# Patient Record
Sex: Male | Born: 1935 | Race: White | Hispanic: No | Smoking: Former smoker
Health system: Southern US, Community
[De-identification: ages and names within clinical notes are randomized; demographics above are authoritative.]

## PROBLEM LIST (undated history)

## (undated) DIAGNOSIS — I10 Essential (primary) hypertension: Secondary | ICD-10-CM

## (undated) DIAGNOSIS — N189 Chronic kidney disease, unspecified: Secondary | ICD-10-CM

---

## 2004-06-03 ENCOUNTER — Ambulatory Visit: Payer: Self-pay | Admitting: Surgery

## 2004-06-09 ENCOUNTER — Ambulatory Visit: Payer: Self-pay | Admitting: Surgery

## 2012-06-24 ENCOUNTER — Emergency Department: Payer: Self-pay | Admitting: Emergency Medicine

## 2012-06-24 LAB — URINALYSIS, COMPLETE
Bilirubin,UR: NEGATIVE
Blood: NEGATIVE
Glucose,UR: NEGATIVE mg/dL (ref 0–75)
Ketone: NEGATIVE
Leukocyte Esterase: NEGATIVE
Ph: 6 (ref 4.5–8.0)
Protein: NEGATIVE
RBC,UR: 2 /HPF (ref 0–5)
Squamous Epithelial: NONE SEEN
WBC UR: 3 /HPF (ref 0–5)

## 2012-06-24 LAB — CBC
MCHC: 33.4 g/dL (ref 32.0–36.0)
MCV: 85 fL (ref 80–100)
RBC: 4.19 10*6/uL — ABNORMAL LOW (ref 4.40–5.90)
RDW: 13 % (ref 11.5–14.5)

## 2012-06-24 LAB — COMPREHENSIVE METABOLIC PANEL
Albumin: 3.9 g/dL (ref 3.4–5.0)
Bilirubin,Total: 1.2 mg/dL — ABNORMAL HIGH (ref 0.2–1.0)
Co2: 29 mmol/L (ref 21–32)
Creatinine: 0.74 mg/dL (ref 0.60–1.30)
Osmolality: 282 (ref 275–301)
SGOT(AST): 29 U/L (ref 15–37)
SGPT (ALT): 21 U/L (ref 12–78)
Sodium: 141 mmol/L (ref 136–145)

## 2012-06-24 LAB — PROTIME-INR
INR: 1.1
Prothrombin Time: 14.1 secs (ref 11.5–14.7)

## 2012-06-24 LAB — TROPONIN I: Troponin-I: <0.02 ng/mL

## 2016-03-04 ENCOUNTER — Encounter: Admission: RE | Payer: Self-pay | Source: Ambulatory Visit

## 2016-03-04 ENCOUNTER — Ambulatory Visit
Admission: RE | Admit: 2016-03-04 | Payer: Medicare Other | Source: Ambulatory Visit | Admitting: Unknown Physician Specialty

## 2016-03-04 SURGERY — COLONOSCOPY WITH PROPOFOL
Anesthesia: General

## 2016-03-10 ENCOUNTER — Encounter: Payer: Self-pay | Admitting: *Deleted

## 2016-03-11 ENCOUNTER — Ambulatory Visit
Admission: RE | Admit: 2016-03-11 | Discharge: 2016-03-11 | Disposition: A | Discharge status: Home / Self Care | Payer: Medicare Other | Source: Ambulatory Visit | Attending: Unknown Physician Specialty | Admitting: Unknown Physician Specialty

## 2016-03-11 ENCOUNTER — Encounter
Admission: RE | Disposition: A | Discharge status: Home / Self Care | Payer: Self-pay | Source: Ambulatory Visit | Attending: Unknown Physician Specialty

## 2016-03-11 ENCOUNTER — Ambulatory Visit: Payer: Medicare Other | Admitting: Anesthesiology

## 2016-03-11 DIAGNOSIS — D123 Benign neoplasm of transverse colon: Secondary | ICD-10-CM | POA: Diagnosis not present

## 2016-03-11 DIAGNOSIS — K640 First degree hemorrhoids: Secondary | ICD-10-CM | POA: Insufficient documentation

## 2016-03-11 DIAGNOSIS — Z79899 Other long term (current) drug therapy: Secondary | ICD-10-CM | POA: Diagnosis not present

## 2016-03-11 DIAGNOSIS — N189 Chronic kidney disease, unspecified: Secondary | ICD-10-CM | POA: Insufficient documentation

## 2016-03-11 DIAGNOSIS — K635 Polyp of colon: Secondary | ICD-10-CM | POA: Insufficient documentation

## 2016-03-11 DIAGNOSIS — K269 Duodenal ulcer, unspecified as acute or chronic, without hemorrhage or perforation: Secondary | ICD-10-CM | POA: Diagnosis not present

## 2016-03-11 DIAGNOSIS — K2950 Unspecified chronic gastritis without bleeding: Secondary | ICD-10-CM | POA: Diagnosis not present

## 2016-03-11 DIAGNOSIS — I8500 Esophageal varices without bleeding: Secondary | ICD-10-CM | POA: Diagnosis not present

## 2016-03-11 DIAGNOSIS — D509 Iron deficiency anemia, unspecified: Secondary | ICD-10-CM | POA: Insufficient documentation

## 2016-03-11 DIAGNOSIS — I129 Hypertensive chronic kidney disease with stage 1 through stage 4 chronic kidney disease, or unspecified chronic kidney disease: Secondary | ICD-10-CM | POA: Diagnosis not present

## 2016-03-11 DIAGNOSIS — K5730 Diverticulosis of large intestine without perforation or abscess without bleeding: Secondary | ICD-10-CM | POA: Diagnosis not present

## 2016-03-11 DIAGNOSIS — Z87891 Personal history of nicotine dependence: Secondary | ICD-10-CM | POA: Insufficient documentation

## 2016-03-11 DIAGNOSIS — Q2733 Arteriovenous malformation of digestive system vessel: Secondary | ICD-10-CM | POA: Insufficient documentation

## 2016-03-11 HISTORY — DX: Chronic kidney disease, unspecified: N18.9

## 2016-03-11 HISTORY — PX: ESOPHAGOGASTRODUODENOSCOPY (EGD) WITH PROPOFOL: SHX5813

## 2016-03-11 HISTORY — DX: Essential (primary) hypertension: I10

## 2016-03-11 HISTORY — PX: COLONOSCOPY WITH PROPOFOL: SHX5780

## 2016-03-11 SURGERY — COLONOSCOPY WITH PROPOFOL
Anesthesia: General

## 2016-03-11 MED ORDER — FENTANYL CITRATE (PF) 100 MCG/2ML IJ SOLN
INTRAMUSCULAR | Status: AC
  Filled 2016-03-11: qty 2

## 2016-03-11 MED ORDER — LIDOCAINE HCL (PF) 1 % IJ SOLN
2.0000 mL | Freq: Once | INTRAMUSCULAR | Status: AC
  Administered 2016-03-11: 0.03 mL via INTRADERMAL
  Filled 2016-03-11: qty 2

## 2016-03-11 MED ORDER — MIDAZOLAM HCL 5 MG/5ML IJ SOLN
INTRAMUSCULAR | Status: DC | PRN
  Administered 2016-03-11 (×2): 1 mg via INTRAVENOUS

## 2016-03-11 MED ORDER — MIDAZOLAM HCL 2 MG/2ML IJ SOLN
INTRAMUSCULAR | Status: AC
  Filled 2016-03-11: qty 2

## 2016-03-11 MED ORDER — PROPOFOL 10 MG/ML IV BOLUS
INTRAVENOUS | Status: DC | PRN
  Administered 2016-03-11: 20 mg via INTRAVENOUS
  Administered 2016-03-11: 10 mg via INTRAVENOUS
  Administered 2016-03-11: 20 mg via INTRAVENOUS

## 2016-03-11 MED ORDER — SODIUM CHLORIDE 0.9 % IV SOLN: INTRAVENOUS | Status: DC

## 2016-03-11 MED ORDER — LIDOCAINE HCL (PF) 2 % IJ SOLN
INTRAMUSCULAR | Status: DC | PRN
  Administered 2016-03-11: 50 mg

## 2016-03-11 MED ORDER — FENTANYL CITRATE (PF) 100 MCG/2ML IJ SOLN
INTRAMUSCULAR | Status: DC | PRN
  Administered 2016-03-11 (×4): 25 ug via INTRAVENOUS

## 2016-03-11 MED ORDER — SODIUM CHLORIDE 0.9 % IV SOLN
INTRAVENOUS | Status: DC
  Administered 2016-03-11: 1000 mL via INTRAVENOUS

## 2016-03-11 MED ORDER — PROPOFOL 500 MG/50ML IV EMUL
INTRAVENOUS | Status: DC | PRN
  Administered 2016-03-11: 50 ug/kg/min via INTRAVENOUS

## 2016-03-11 NOTE — Anesthesia Post-op Follow-up Note (Cosign Needed)
Anesthesia QCDR form completed.        

## 2016-03-11 NOTE — Op Note (Signed)
[facility] Patient Name: Caleb Reynolds Procedure Date: 03/11/2016 10:03 AM MRN: [id number] Account #: [id number] Date of Birth: 28-Sep-1935 Admit Type: Outpatient Age: 81 Room: [facility] ENDO ROOM 4 Gender: Male Note Status: Finalized Procedure:            Upper GI endoscopy Indications:          Unexplained iron deficiency anemia, Heme positive stool Providers:            Manya Silvas, MD Referring MD:         Leona Carry. Hall Busing, MD (Referring MD) Medicines:            Propofol per Anesthesia Complications:        No immediate complications. Procedure:            Pre-Anesthesia Assessment:                       - After reviewing the risks and benefits, the patient                        was deemed in satisfactory condition to undergo the                        procedure.                       After obtaining informed consent, the endoscope was                        passed under direct vision. Throughout the procedure,                        the patient's blood pressure, pulse, and oxygen                        saturations were monitored continuously. The Endoscope                        was introduced through the mouth, and advanced to the                        second part of duodenum. The upper GI endoscopy was                        accomplished without difficulty. The patient tolerated                        the procedure well. Findings:      One column of non-bleeding grade I varices were found in the middle       third of the esophagus and in the lower third of the esophagus,. No       stigmata of recent bleeding were evident and no red wale signs were       present.      A single small no bleeding angioectasia was found on the lesser       curvature of the proximal gastric body. Coagulation for tissue       destruction using argon plasma at 1 liters/minute and 20 watts was       successful. To prevent bleeding post-intervention, one  hemostatic clip       was successfully placed. There  was no bleeding at the end of the       procedure.      Patchy minimal inflammation characterized by erythema and granularity       was found in the gastric antrum. Biopsies were taken with a cold forceps       for histology. Biopsies were taken with a cold forceps for Helicobacter       pylori testing.      One non-bleeding superficial duodenal ulcer with no stigmata of bleeding       was found in the second portion of the duodenum. The lesion was 4 mm in       largest dimension.      A small hiatal hernia was present. 2 cm in length. Impression:           - Non-bleeding grade I esophageal varices.                       - A single non-bleeding angioectasia in the stomach.                        Treated with argon plasma coagulation (APC). Clip was                        placed.                       - Gastritis. Biopsied.                       - One non-bleeding duodenal ulcer with no stigmata of                        bleeding. Recommendation:       - Await pathology results. Manya Silvas, MD 03/11/2016 10:28:43 AM This report has been signed electronically. Number of Addenda: 0 Note Initiated On: 03/11/2016 10:03 AM      [facility]

## 2016-03-11 NOTE — Transfer of Care (Signed)
Immediate Anesthesia Transfer of Care Note  Patient: Caleb Reynolds  Procedure(s) Performed: Procedure(s): COLONOSCOPY WITH PROPOFOL (N/A) ESOPHAGOGASTRODUODENOSCOPY (EGD) WITH PROPOFOL (N/A)  Patient Location: PACU  Anesthesia Type:General  Level of Consciousness: sedated  Airway & Oxygen Therapy: Patient Spontanous Breathing and Patient connected to nasal cannula oxygen  Post-op Assessment: Report given to RN and Post -op Vital signs reviewed and stable  Post vital signs: Reviewed  Last Vitals:  Vitals:   03/11/16 0922 03/11/16 1056  BP: (!) 143/68 117/67  Pulse: 85 65  Resp: 17 13  Temp: 36.1 C 36.2 C    Last Pain:  Vitals:   03/11/16 1056  TempSrc: Tympanic         Complications: No apparent anesthesia complications

## 2016-03-11 NOTE — H&P (Signed)
   Primary Care Physician:  Albina Billet, MD Primary Gastroenterologist:  Dr. Vira Agar  Pre-Procedure History & Physical: HPI:  Caleb Reynolds is a 81 y.o. male is here for an endoscopy and colonoscopy.   Past Medical History:  Diagnosis Date  . Chronic kidney disease    kidney stones  . Hypertension     No past surgical history on file.  Prior to Admission medications   Medication Sig Start Date End Date Taking? Authorizing Provider  amLODipine (NORVASC) 10 MG tablet Take 10 mg by mouth daily.   Yes Historical Provider, MD  pantoprazole (PROTONIX) 40 MG tablet Take 40 mg by mouth 2 (two) times daily before a meal.   Yes Historical Provider, MD  sucralfate (CARAFATE) 1 g tablet Take 1 g by mouth 4 (four) times daily.   Yes Historical Provider, MD    Allergies as of 03/09/2016  . (Not on File)    No family history on file.  Social History   Social History  . Marital status: Married    Spouse name: N/A  . Number of children: N/A  . Years of education: N/A   Occupational History  . Not on file.   Social History Main Topics  . Smoking status: Former Research scientist (life sciences)  . Smokeless tobacco: Never Used  . Alcohol use No  . Drug use: No  . Sexual activity: Not on file   Other Topics Concern  . Not on file   Social History Narrative  . No narrative on file    Review of Systems: See HPI, otherwise negative ROS  Physical Exam: BP (!) 143/68   Pulse 85   Temp 97 F (36.1 C) (Tympanic)   Resp 17   Ht 5\' 11"  (1.803 m)   Wt 70.3 kg (155 lb)   SpO2 99%   BMI 21.62 kg/m  General:   Alert,  pleasant and cooperative in NAD Head:  Normocephalic and atraumatic. Neck:  Supple; no masses or thyromegaly. Lungs:  Clear throughout to auscultation.    Heart:  Regular rate and rhythm. Abdomen:  Soft, nontender and nondistended. Normal bowel sounds, without guarding, and without rebound.   Neurologic:  Alert and  oriented x4;  grossly normal  neurologically.  Impression/Plan: Caleb Reynolds is here for an endoscopy and colonoscopy to be performed for iron def anemia and heme positive stool.  Risks, benefits, limitations, and alternatives regarding  endoscopy and colonoscopy have been reviewed with the patient.  Questions have been answered.  All parties agreeable.   Gaylyn Cheers, MD  03/11/2016, 9:59 AM

## 2016-03-11 NOTE — Anesthesia Preprocedure Evaluation (Signed)
Anesthesia Evaluation  Patient identified by MRN, date of birth, ID band Patient awake    Reviewed: Allergy & Precautions, NPO status , Patient's Chart, lab work & pertinent test results  History of Anesthesia Complications Negative for: history of anesthetic complications  Airway Mallampati: II  TM Distance: >3 FB Neck ROM: Full    Dental  (+) Missing, Poor Dentition   Pulmonary neg sleep apnea, neg COPD, former smoker,    breath sounds clear to auscultation- rhonchi (-) wheezing      Cardiovascular Exercise Tolerance: Good hypertension, Pt. on medications (-) CAD and (-) Past MI  Rhythm:Regular Rate:Normal - Systolic murmurs and - Diastolic murmurs    Neuro/Psych negative neurological ROS  negative psych ROS   GI/Hepatic negative GI ROS, Neg liver ROS,   Endo/Other  negative endocrine ROSneg diabetes  Renal/GU negative Renal ROS     Musculoskeletal negative musculoskeletal ROS (+)   Abdominal (+) - obese,   Peds  Hematology negative hematology ROS (+)   Anesthesia Other Findings Past Medical History: No date: Chronic kidney disease     Comment: kidney stones No date: Hypertension   Reproductive/Obstetrics                             Anesthesia Physical Anesthesia Plan  ASA: II  Anesthesia Plan: General   Post-op Pain Management:    Induction: Intravenous  Airway Management Planned: Natural Airway  Additional Equipment:   Intra-op Plan:   Post-operative Plan:   Informed Consent: I have reviewed the patients History and Physical, chart, labs and discussed the procedure including the risks, benefits and alternatives for the proposed anesthesia with the patient or authorized representative who has indicated his/her understanding and acceptance.   Dental advisory given  Plan Discussed with: CRNA and Anesthesiologist  Anesthesia Plan Comments:         Anesthesia  Quick Evaluation

## 2016-03-11 NOTE — Anesthesia Postprocedure Evaluation (Signed)
Anesthesia Post Note  Patient: Caleb Reynolds  Procedure(s) Performed: Procedure(s) (LRB): COLONOSCOPY WITH PROPOFOL (N/A) ESOPHAGOGASTRODUODENOSCOPY (EGD) WITH PROPOFOL (N/A)  Patient location during evaluation: Endoscopy Anesthesia Type: General Level of consciousness: awake and alert and oriented Pain management: pain level controlled Vital Signs Assessment: post-procedure vital signs reviewed and stable Respiratory status: spontaneous breathing, nonlabored ventilation and respiratory function stable Cardiovascular status: blood pressure returned to baseline and stable Postop Assessment: no signs of nausea or vomiting Anesthetic complications: no     Last Vitals:  Vitals:   03/11/16 1106 03/11/16 1116  BP: 125/69 135/74  Pulse: 65 71  Resp: 14 16  Temp:      Last Pain:  Vitals:   03/11/16 1056  TempSrc: Tympanic                 Sira [employer]ds[employer]

## 2016-03-11 NOTE — Op Note (Signed)
[facility] Patient Name: Caleb Reynolds Procedure Date: 03/11/2016 10:01 AM MRN: [id number] Account #: [id number] Date of Birth: [birth year] Admit Type: Outpatient Age: 81 Room: [facility] ENDO ROOM 4 Gender: Male Note Status: Finalized Procedure:            Colonoscopy Indications:          Heme positive stool, Unexplained iron deficiency anemia Providers:            Manya Silvas, MD Referring MD:         Leona Carry. Hall Busing, MD (Referring MD) Medicines:            Propofol per Anesthesia Complications:        No immediate complications. Procedure:            Pre-Anesthesia Assessment:                       - After reviewing the risks and benefits, the patient                        was deemed in satisfactory condition to undergo the                        procedure.                       After obtaining informed consent, the colonoscope was                        passed under direct vision. Throughout the procedure,                        the patient's blood pressure, pulse, and oxygen                        saturations were monitored continuously. The                        Colonoscope was introduced through the anus and                        advanced to the the cecum, identified by appendiceal                        orifice and ileocecal valve. The colonoscopy was                        performed without difficulty. The patient tolerated the                        procedure well. The quality of the bowel preparation                        was excellent. Findings:      Three sessile polyps were found in the sigmoid colon and transverse       colon. The polyps were small in size. These polyps were removed with a       hot snare. Resection and retrieval were complete. To prevent bleeding       after the polypectomy, one hemostatic clip was successfully placed.       There was no bleeding during, or at  the end, of the procedure.      Many  small-mouthed diverticula were found in the sigmoid colon and       descending colon.      Internal hemorrhoids were found during endoscopy. The hemorrhoids were       small and Grade I (internal hemorrhoids that do not prolapse).      The exam was otherwise without abnormality. Impression:           - Three small polyps in the sigmoid colon and in the                        transverse colon, removed with a hot snare. Resected                        and retrieved. Clip was placed.                       - Diverticulosis in the sigmoid colon and in the                        descending colon.                       - Internal hemorrhoids.                       - The examination was otherwise normal. Recommendation:       - Await pathology results. Manya Silvas, MD 03/11/2016 10:56:51 AM This report has been signed electronically. Number of Addenda: 0 Note Initiated On: 03/11/2016 10:01 AM Scope Withdrawal Time: 0 hours 12 minutes 36 seconds  Total Procedure Duration: 0 hours 23 minutes 22 seconds       [facility]

## 2016-03-12 ENCOUNTER — Encounter: Payer: Self-pay | Admitting: Unknown Physician Specialty

## 2016-03-12 LAB — SURGICAL PATHOLOGY

## 2019-05-22 ENCOUNTER — Encounter: Payer: Self-pay | Admitting: Emergency Medicine

## 2019-05-22 ENCOUNTER — Inpatient Hospital Stay
Admission: EM | Admit: 2019-05-22 | Discharge: 2019-05-24 | DRG: 066 | Disposition: A | Discharge status: Home / Self Care | Payer: Medicare Other | Attending: Internal Medicine | Admitting: Internal Medicine

## 2019-05-22 ENCOUNTER — Emergency Department: Payer: Medicare Other

## 2019-05-22 ENCOUNTER — Other Ambulatory Visit: Payer: Self-pay

## 2019-05-22 DIAGNOSIS — I639 Cerebral infarction, unspecified: Principal | ICD-10-CM | POA: Diagnosis present

## 2019-05-22 DIAGNOSIS — N189 Chronic kidney disease, unspecified: Secondary | ICD-10-CM | POA: Diagnosis present

## 2019-05-22 DIAGNOSIS — E785 Hyperlipidemia, unspecified: Secondary | ICD-10-CM | POA: Diagnosis present

## 2019-05-22 DIAGNOSIS — I129 Hypertensive chronic kidney disease with stage 1 through stage 4 chronic kidney disease, or unspecified chronic kidney disease: Secondary | ICD-10-CM | POA: Diagnosis present

## 2019-05-22 DIAGNOSIS — I10 Essential (primary) hypertension: Secondary | ICD-10-CM

## 2019-05-22 DIAGNOSIS — Z87891 Personal history of nicotine dependence: Secondary | ICD-10-CM

## 2019-05-22 DIAGNOSIS — Z20822 Contact with and (suspected) exposure to covid-19: Secondary | ICD-10-CM | POA: Diagnosis present

## 2019-05-22 DIAGNOSIS — R29700 NIHSS score 0: Secondary | ICD-10-CM | POA: Diagnosis present

## 2019-05-22 DIAGNOSIS — H532 Diplopia: Secondary | ICD-10-CM | POA: Diagnosis present

## 2019-05-22 LAB — COMPREHENSIVE METABOLIC PANEL
ALT: 17 U/L (ref 0–44)
AST: 21 U/L (ref 15–41)
Albumin: 4.5 g/dL (ref 3.5–5.0)
Alkaline Phosphatase: 25 U/L — ABNORMAL LOW (ref 38–126)
Anion gap: 9 (ref 5–15)
BUN: 8 mg/dL (ref 8–23)
CO2: 28 mmol/L (ref 22–32)
Calcium: 8.8 mg/dL — ABNORMAL LOW (ref 8.9–10.3)
Chloride: 101 mmol/L (ref 98–111)
Creatinine, Ser: 0.68 mg/dL (ref 0.61–1.24)
GFR calc Af Amer: >60 mL/min (ref >60)
GFR calc non Af Amer: >60 mL/min (ref >60)
Glucose, Bld: 111 mg/dL — ABNORMAL HIGH (ref 70–99)
Potassium: 3.9 mmol/L (ref 3.5–5.1)
Sodium: 138 mmol/L (ref 135–145)
Total Bilirubin: 1.6 mg/dL — ABNORMAL HIGH (ref 0.3–1.2)
Total Protein: 7.6 g/dL (ref 6.5–8.1)

## 2019-05-22 LAB — CBC
HCT: 44.2 % (ref 39.0–52.0)
Hemoglobin: 14.6 g/dL (ref 13.0–17.0)
MCH: 30.2 pg (ref 26.0–34.0)
MCHC: 33 g/dL (ref 30.0–36.0)
MCV: 91.5 fL (ref 80.0–100.0)
Platelets: 300 10*3/uL (ref 150–400)
RBC: 4.83 MIL/uL (ref 4.22–5.81)
RDW: 12.1 % (ref 11.5–15.5)
WBC: 5.2 10*3/uL (ref 4.0–10.5)
nRBC: 0 % (ref 0.0–0.2)

## 2019-05-22 LAB — APTT: aPTT: 36 seconds (ref 24–36)

## 2019-05-22 LAB — DIFFERENTIAL
Abs Immature Granulocytes: 0.04 10*3/uL (ref 0.00–0.07)
Basophils Absolute: 0.1 10*3/uL (ref 0.0–0.1)
Basophils Relative: 2 %
Eosinophils Absolute: 0 10*3/uL (ref 0.0–0.5)
Eosinophils Relative: 0 %
Immature Granulocytes: 1 %
Lymphocytes Relative: 19 %
Lymphs Abs: 1 10*3/uL (ref 0.7–4.0)
Monocytes Absolute: 0.4 10*3/uL (ref 0.1–1.0)
Monocytes Relative: 7 %
Neutro Abs: 3.7 10*3/uL (ref 1.7–7.7)
Neutrophils Relative %: 71 %

## 2019-05-22 LAB — PROTIME-INR
INR: 1 (ref 0.8–1.2)
Prothrombin Time: 13.2 seconds (ref 11.4–15.2)

## 2019-05-22 MED ORDER — SODIUM CHLORIDE 0.9% FLUSH: 3.0000 mL | Freq: Once | INTRAVENOUS | Status: DC

## 2019-05-22 MED ORDER — ACETAMINOPHEN 160 MG/5ML PO SOLN
650.0000 mg | ORAL | Status: DC | PRN
  Filled 2019-05-22: qty 20.3

## 2019-05-22 MED ORDER — ACETAMINOPHEN 325 MG PO TABS: 650.0000 mg | ORAL_TABLET | ORAL | Status: DC | PRN

## 2019-05-22 MED ORDER — SENNOSIDES-DOCUSATE SODIUM 8.6-50 MG PO TABS: 1.0000 | ORAL_TABLET | Freq: Every evening | ORAL | Status: DC | PRN

## 2019-05-22 MED ORDER — ENOXAPARIN SODIUM 40 MG/0.4ML ~~LOC~~ SOLN
40.0000 mg | SUBCUTANEOUS | Status: DC
  Administered 2019-05-23 – 2019-05-24 (×2): 40 mg via SUBCUTANEOUS
  Filled 2019-05-22 (×2): qty 0.4

## 2019-05-22 MED ORDER — GADOBUTROL 1 MMOL/ML IV SOLN
6.0000 mL | Freq: Once | INTRAVENOUS | Status: AC | PRN
  Administered 2019-05-22: 22:00:00 6 mL via INTRAVENOUS

## 2019-05-22 MED ORDER — MECLIZINE HCL 25 MG PO TABS
25.0000 mg | ORAL_TABLET | Freq: Once | ORAL | Status: AC
  Administered 2019-05-22: 25 mg via ORAL
  Filled 2019-05-22: qty 1

## 2019-05-22 MED ORDER — ASPIRIN EC 81 MG PO TBEC
81.0000 mg | DELAYED_RELEASE_TABLET | Freq: Every day | ORAL | Status: DC
  Administered 2019-05-23 – 2019-05-24 (×2): 81 mg via ORAL
  Filled 2019-05-22 (×2): qty 1

## 2019-05-22 MED ORDER — ASPIRIN 81 MG PO CHEW
162.0000 mg | CHEWABLE_TABLET | Freq: Once | ORAL | Status: AC
  Administered 2019-05-22: 162 mg via ORAL
  Filled 2019-05-22: qty 2

## 2019-05-22 MED ORDER — ACETAMINOPHEN 650 MG RE SUPP: 650.0000 mg | RECTAL | Status: DC | PRN

## 2019-05-22 MED ORDER — STROKE: EARLY STAGES OF RECOVERY BOOK: Freq: Once | Status: DC

## 2019-05-22 MED ORDER — ATORVASTATIN CALCIUM 20 MG PO TABS: 40.0000 mg | ORAL_TABLET | Freq: Every day | ORAL | Status: DC

## 2019-05-22 NOTE — ED Provider Notes (Signed)
[facility] Emergency Department Provider Note ____________________________________________   First MD Initiated Contact with Patient 05/22/19 1601     (approximate)  I have reviewed the triage vital signs and the nursing notes.   HISTORY  Chief Complaint Dizziness    HPI Caleb Reynolds is a 84 y.o. male with PMH as noted below who presents with dizziness since around 8 PM yesterday evening, described as a sensation of difficulty keeping his balance, and worse when he gets up and walks around.  He denies feeling near syncopal, and also denies a specific sensation spinning or movement.  He has not had anything like this before.  The patient also reports some double vision which resolves when he covers one eye.  He denies any nausea or vomiting, headache, fever, or other acute symptoms.  The patient went to see his primary care doctor today and was instructed to go to the ED for further evaluation.  Past Medical History:  Diagnosis Date  . Chronic kidney disease    kidney stones  . Hypertension     Patient Active Problem List   Diagnosis Date Noted  . Acute CVA (cerebrovascular accident) ([place]) 05/22/2019  . Essential hypertension 05/22/2019    Past Surgical History:  Procedure Laterality Date  . COLONOSCOPY WITH PROPOFOL N/A 03/11/2016   Procedure: COLONOSCOPY WITH PROPOFOL;  Surgeon: Manya Silvas, MD;  Location: [facility] ENDOSCOPY;  Service: Endoscopy;  Laterality: N/A;  . ESOPHAGOGASTRODUODENOSCOPY (EGD) WITH PROPOFOL N/A 03/11/2016   Procedure: ESOPHAGOGASTRODUODENOSCOPY (EGD) WITH PROPOFOL;  Surgeon: Manya Silvas, MD;  Location: [facility] ENDOSCOPY;  Service: Endoscopy;  Laterality: N/A;    Prior to Admission medications   Medication Sig Start Date End Date Taking? Authorizing Provider  amLODipine (NORVASC) 10 MG tablet Take 10 mg by mouth daily.    [provider]  pantoprazole (PROTONIX) 40 MG tablet Take 40 mg by mouth 2 (two) times  daily before a meal.    [provider]  sucralfate (CARAFATE) 1 g tablet Take 1 g by mouth 4 (four) times daily.    [provider]    Allergies Patient has no known allergies.  No family history on file.  Social History Social History   Tobacco Use  . Smoking status: Former Research scientist (life sciences)  . Smokeless tobacco: Never Used  Substance Use Topics  . Alcohol use: No  . Drug use: No    Review of Systems  Constitutional: No fever. Eyes: Positive for diplopia. ENT: No neck pain. Cardiovascular: Denies chest pain. Respiratory: Denies shortness of breath. Gastrointestinal: No vomiting or diarrhea.  Genitourinary: Negative for dysuria.  Musculoskeletal: Negative for back pain. Skin: Negative for rash. Neurological: Negative for headache.   ____________________________________________   PHYSICAL EXAM:  VITAL SIGNS: ED Triage Vitals  Enc Vitals Group     BP 05/22/19 1216 (!) 159/66     Pulse Rate 05/22/19 1216 93     Resp 05/22/19 1216 16     Temp 05/22/19 1216 98.4 F (36.9 C)     Temp Source 05/22/19 1216 Oral     SpO2 05/22/19 1216 98 %     Weight 05/22/19 1213 150 lb (68 kg)     Height 05/22/19 1213 5\' 11"  (1.803 m)     Head Circumference --      Peak Flow --      Pain Score 05/22/19 1213 0     Pain Loc --      Pain Edu? --  Excl. in [place]? --     Constitutional: Alert and oriented.  Very well appearing and in no acute distress. Eyes: Conjunctivae are normal.  EOMI.  PERRLA.  No nystagmus. Head: Atraumatic. Nose: No congestion/rhinnorhea. Mouth/Throat: Mucous membranes are moist.   Neck: Normal range of motion.  No palpable thrill.  No significant bruits. Cardiovascular: Normal rate, regular rhythm. Good peripheral circulation. Respiratory: Normal respiratory effort.  No retractions. Lungs CTAB. Gastrointestinal: No distention.  Musculoskeletal:  Extremities warm and well perfused.  Neurologic:  Normal speech and language.  5/5 motor strength  and intact sensation to all 4 extremities.  No pronator drift.  Normal coordination with no ataxia on finger-to-nose.  No facial droop.  Normal gait. Skin:  Skin is warm and dry. No rash noted. Psychiatric: Mood and affect are normal. Speech and behavior are normal.  ____________________________________________   LABS (all labs ordered are listed, but only abnormal results are displayed)  Labs Reviewed  COMPREHENSIVE METABOLIC PANEL - Abnormal; Notable for the following components:      Result Value   Glucose, Bld 111 (*)    Calcium 8.8 (*)    Alkaline Phosphatase 25 (*)    Total Bilirubin 1.6 (*)    All other components within normal limits  SARS CORONAVIRUS 2 (TAT 6-24 HRS)  PROTIME-INR  APTT  CBC  DIFFERENTIAL  HEMOGLOBIN A1C  LIPID PANEL  CREATININE, SERUM  I-STAT CREATININE, ED  CBG MONITORING, ED   ____________________________________________  EKG  ED ECG REPORT I, Arta Silence, the attending physician, personally viewed and interpreted this ECG.  Date: 05/22/2019 EKG Time: 1223 Rate: 90 Rhythm: normal sinus rhythm QRS Axis: normal Intervals: normal ST/T Wave abnormalities: normal Narrative Interpretation: no evidence of acute ischemia  ____________________________________________  RADIOLOGY  CT head: No acute abnormality MR brain: 2 cm left temporal lobe infarct  MR angio head: M2 right MCA branch stenosis MR angio neck: Possible left vertebral artery moderate stenosis, limited by motion.  Patent carotids.  ____________________________________________   PROCEDURES  Procedure(s) performed: No  Procedures  Critical Care performed: No ____________________________________________   INITIAL IMPRESSION / ASSESSMENT AND PLAN / ED COURSE  Pertinent labs & imaging results that were available during my care of the patient were reviewed by me and considered in my medical decision making (see chart for details).  84 year old male with PMH as  noted above and no prior stroke history presents with dizziness since last night mainly described as a sensation of being off balance, along with diplopia.  The patient saw his primary care doctor who instructed him to go to the ED.  He also mentioned hearing an abnormal sound all listening to his neck, presumably a bruit.  I reviewed the past medical records in [place].  I do not have access to notes from the patient's PCP today.  The patient has no recent ED visits or admissions.  On exam, the patient is overall very well-appearing for age.  His vital signs are normal except for mild hypertension.  Neurologic exam is normal including cerebellar signs.  I do not hear an obvious bruit in the neck although exam is somewhat limited by the latter environment of the ED.  Overall I suspect more likely peripheral vertigo given the relatively acute onset of the symptoms the presence of some nausea, and their positional nature.  However given the patient's age and description of the symptoms, central vertigo or other CNS etiology is possible.  CT head was negative.  I will proceed with an  MRI of the brain as well as an angio of the head and neck.   ----------------------------------------- 11:09 PM on 05/22/2019 -----------------------------------------  The patient has had no significant improvement in his symptoms, although is still able to ambulate and his overall coordination is normal.  MRI shows an acute infarct in the left temporal lobe and some stenosis in an MCA branch and a few other vessels.  Given this finding, I recommended admission.  The patient agrees with this.  I discussed his case with the hospitalist.  ____________________________________________   FINAL CLINICAL IMPRESSION(S) / ED DIAGNOSES  Final diagnoses:  Cerebrovascular accident (CVA), unspecified mechanism ([place])      NEW MEDICATIONS STARTED DURING THIS VISIT:  New Prescriptions   No medications on file     Note:  This  document was prepared using Dragon voice recognition software and may include unintentional dictation errors.    Arta Silence, MD 05/22/19 2310

## 2019-05-22 NOTE — H&P (Signed)
History and Physical    Caleb Reynolds [id number] DOB: 02-01-1936 DOA: 05/22/2019  PCP: Albina Billet, MD   Patient coming from: home I have personally briefly reviewed patient's old medical records in [place]  Chief Complaint: Dizziness, unsteady gait, double vision  HPI: Caleb Reynolds is a 84 y.o. male with medical history significant for hypertension, who presented to the emergency room with dizziness, unsteady gait and double vision that started around 8 PM on 05/21/2019.  He went to see his PCP today who advised him to come into the emergency room.  Since the onset, states his symptoms are unchanged, have not worsened or lessened.  He denies nausea or vomiting, has a sensation of spinning, denies fever chills or headache.  ED Course: Arrival in the ER blood pressure was 159/66 with otherwise unremarkable vitals.  His blood work was unremarkable.  EKG showed normal sinus rhythm.  Head CT showed no acute disease.  He subsequently had an MRI which showed an acute infarct in the periventricular left temporal lobe.  An MRA of the head and neck showed multifocal stenoses.  Hospitalist consulted for admission.  Review of Systems: As per HPI otherwise 10 point review of systems negative.    Past Medical History:  Diagnosis Date  . Chronic kidney disease    kidney stones  . Hypertension     Past Surgical History:  Procedure Laterality Date  . COLONOSCOPY WITH PROPOFOL N/A 03/11/2016   Procedure: COLONOSCOPY WITH PROPOFOL;  Surgeon: Manya Silvas, MD;  Location: [facility] ENDOSCOPY;  Service: Endoscopy;  Laterality: N/A;  . ESOPHAGOGASTRODUODENOSCOPY (EGD) WITH PROPOFOL N/A 03/11/2016   Procedure: ESOPHAGOGASTRODUODENOSCOPY (EGD) WITH PROPOFOL;  Surgeon: Manya Silvas, MD;  Location: [facility] ENDOSCOPY;  Service: Endoscopy;  Laterality: N/A;     reports that he has quit smoking. He has never used smokeless tobacco. He reports that he does not drink alcohol or use  drugs.  No Known Allergies  No family history on file.   Prior to Admission medications   Medication Sig Start Date End Date Taking? Authorizing Provider  amLODipine (NORVASC) 10 MG tablet Take 10 mg by mouth daily.    [provider]  pantoprazole (PROTONIX) 40 MG tablet Take 40 mg by mouth 2 (two) times daily before a meal.    [provider]  sucralfate (CARAFATE) 1 g tablet Take 1 g by mouth 4 (four) times daily.    [provider]    Physical Exam: Vitals:   05/22/19 1213 05/22/19 1216  BP:  (!) 159/66  Pulse:  93  Resp:  16  Temp:  98.4 F (36.9 C)  TempSrc:  Oral  SpO2:  98%  Weight: 68 kg   Height: 5\' 11"  (1.803 m)      Vitals:   05/22/19 1213 05/22/19 1216  BP:  (!) 159/66  Pulse:  93  Resp:  16  Temp:  98.4 F (36.9 C)  TempSrc:  Oral  SpO2:  98%  Weight: 68 kg   Height: 5\' 11"  (1.803 m)     Constitutional: Alert and awake, oriented x3, not in any acute distress. Eyes: PERLA, EOMI, irises appear normal, anicteric sclera,  ENMT: external ears and nose appear normal, normal hearing             Lips appears normal, oropharynx mucosa, tongue, posterior pharynx appear normal  Neck: neck appears normal, no masses, normal ROM, no thyromegaly, no JVD  CVS: S1-S2 clear, no murmur rubs or  gallops,  , no carotid bruits, pedal pulses palpable, No LE edema Respiratory:  clear to auscultation bilaterally, no wheezing, rales or rhonchi. Respiratory effort normal. No accessory muscle use.  Abdomen: soft nontender, nondistended, normal bowel sounds, no hepatosplenomegaly, no hernias Musculoskeletal: : no cyanosis, clubbing , no contractures or atrophy Neuro: Cranial nerves II-XII intact,  Normal speech and language.  5/5 motor strength and intact sensation to all 4 extremities.  No pronator drift.  Normal coordination with no ataxia on finger-to-nose.   Psych: judgement and insight appear normal, stable mood and affect,  Skin: no rashes or  lesions or ulcers, no induration or nodules   Labs on Admission: I have personally reviewed following labs and imaging studies  CBC: Recent Labs  Lab 05/22/19 1221  WBC 5.2  NEUTROABS 3.7  HGB 14.6  HCT 44.2  MCV 91.5  PLT [id number]   Basic Metabolic Panel: Recent Labs  Lab 05/22/19 1221  NA 138  K 3.9  CL 101  CO2 28  GLUCOSE 111*  BUN 8  CREATININE 0.68  CALCIUM 8.8*   GFR: Estimated Creatinine Clearance: 68.5 mL/min (by C-G formula based on SCr of 0.68 mg/dL). Liver Function Tests: Recent Labs  Lab 05/22/19 1221  AST 21  ALT 17  ALKPHOS 25*  BILITOT 1.6*  PROT 7.6  ALBUMIN 4.5   No results for input(s): LIPASE, AMYLASE in the last 168 hours. No results for input(s): AMMONIA in the last 168 hours. Coagulation Profile: Recent Labs  Lab 05/22/19 1221  INR 1.0   Cardiac Enzymes: No results for input(s): CKTOTAL, CKMB, CKMBINDEX, TROPONINI in the last 168 hours. BNP (last 3 results) No results for input(s): PROBNP in the last 8760 hours. HbA1C: No results for input(s): HGBA1C in the last 72 hours. CBG: No results for input(s): GLUCAP in the last 168 hours. Lipid Profile: No results for input(s): CHOL, HDL, LDLCALC, TRIG, CHOLHDL, LDLDIRECT in the last 72 hours. Thyroid Function Tests: No results for input(s): TSH, T4TOTAL, FREET4, T3FREE, THYROIDAB in the last 72 hours. Anemia Panel: No results for input(s): VITAMINB12, FOLATE, FERRITIN, TIBC, IRON, RETICCTPCT in the last 72 hours. Urine analysis:    Component Value Date/Time   COLORURINE Yellow 06/24/2012 1644   APPEARANCEUR Clear 06/24/2012 1644   LABSPEC 1.014 06/24/2012 1644   PHURINE 6.0 06/24/2012 1644   GLUCOSEU Negative 06/24/2012 1644   HGBUR Negative 06/24/2012 1644   BILIRUBINUR Negative 06/24/2012 1644   KETONESUR Negative 06/24/2012 1644   PROTEINUR Negative 06/24/2012 1644   NITRITE Negative 06/24/2012 1644   LEUKOCYTESUR Negative 06/24/2012 1644    Radiological Exams on  Admission: CT HEAD WO CONTRAST  Result Date: 05/22/2019 CLINICAL DATA:  Acute neuro deficit. Double vision and dizziness. Rule out stroke EXAM: CT HEAD WITHOUT CONTRAST TECHNIQUE: Contiguous axial images were obtained from the base of the skull through the vertex without intravenous contrast. COMPARISON:  CT head 06/24/2012 FINDINGS: Brain: No evidence of acute infarction, hemorrhage, hydrocephalus, extra-axial collection or mass lesion/mass effect. Mild chronic microvascular ischemic change in the white matter. Cerebral volume normal for age. Vascular: Atherosclerotic calcification. Negative for hyperdense vessel Skull: Negative Sinuses/Orbits: Negative Other: None IMPRESSION: No acute abnormality. Electronically Signed   By: Franchot Gallo M.D.   On: 05/22/2019 12:40   MR ANGIO HEAD WO CONTRAST  Result Date: 05/22/2019 CLINICAL DATA:  Focal neuro deficit, greater than 6 hours, stroke suspected. Additional history provided: Patient reports dizziness, double vision, difficulty walking due to difficulty keeping balance, onset last night, symptoms  continue. EXAM: MR HEAD WITHOUT CONTRAST MR CIRCLE OF WILLIS WITHOUT CONTRAST MRA OF THE NECK WITHOUT AND WITH CONTRAST TECHNIQUE: Multiplanar, multiecho pulse sequences of the brain, circle of willis and surrounding structures were obtained without intravenous contrast. Angiographic images of the neck were obtained using MRA technique without and with intravenous contrast. CONTRAST:  26mL GADAVIST GADOBUTROL 1 MMOL/ML IV SOLN COMPARISON:  Head CT 05/22/2019, brain MRI 06/24/2012 FINDINGS: MR HEAD FINDINGS Brain: There is a 2 cm focus of restricted diffusion within the periventricular left temporal lobe, along the atrium and temporal horn of the left lateral ventricle. Findings are consistent with acute infarction. Corresponding T2/FLAIR hyperintensity at this site. There is no evidence of acute infarct elsewhere within the brain. No evidence of intracranial mass. No  midline shift or extra-axial fluid collection. No chronic intracranial blood products. Mild for age scattered T2/FLAIR hyperintensity within the cerebral white matter has progressed from MRI 06/24/2012. Findings are nonspecific, but consistent with chronic small vessel ischemic disease. Sever tiny chronic lacunar infarcts within the left cerebellar hemisphere were not present on this prior MRI. Stable, mild generalized parenchymal atrophy. Vascular: Reported below. Skull and upper cervical spine: No focal marrow lesion. Sinuses/Orbits: Visualized orbits demonstrate no acute abnormality. Mild ethmoid sinus mucosal thickening. Trace fluid within bilateral mastoid air cells. MR CIRCLE OF WILLIS FINDINGS The intracranial internal carotid arteries are patent without significant stenosis. The M1 middle cerebral arteries are patent without significant stenosis. Apparent high-grade focal stenosis within a proximal M2 right MCA branch vessel (series 1045, image 13). No other high-grade proximal M2 stenosis is identified. The anterior cerebral arteries are patent without high-grade proximal stenosis. No intracranial aneurysm is identified. The non dominant intracranial right vertebral artery is patent and appears to terminate predominantly as the right PICA. The dominant intracranial left vertebral artery is patent without significant stenosis, as is the basilar artery. Predominantly fetal origin of the right posterior cerebral artery. The posterior cerebral arteries are patent bilaterally. Sites of up to moderate stenosis within the proximal P2 right PCA. Moderate to moderately severe focal stenosis within the mid P2 left PCA (series 1031, image 5). Atherosclerotic irregularity of distal left PCA branches is also noted. A small left posterior communicating artery is present. MRA NECK FINDINGS Standard aortic branching. No hemodynamically significant stenosis of the innominate or proximal subclavian arteries. The bilateral  common and internal carotid arteries are patent without significant stenosis (50% or greater). The vertebral arteries are patent within the neck bilaterally with antegrade flow. There is limited assessment for stenoses within the non dominant cervical right vertebral artery due to developmental small vessel size. There are multifocal sites of apparent moderate narrowing within the V1 and V2 segments of the dominant cervical left vertebral artery on the postcontrast imaging. However, these stenoses may be accentuated by motion artifact. IMPRESSION: MRI brain: 1. 2 cm acute infarct within the periventricular left temporal lobe. 2. Mild chronic small vessel ischemic changes within the cerebral white matter, progressed as compared MRI 06/24/2012. Several tiny chronic lacunar infarcts within the left cerebellar hemisphere were not present on this prior exam. 3. Stable, mild generalized parenchymal atrophy. 4. Mild ethmoid sinus mucosal thickening. Trace fluid within bilateral mastoid air cells. MRA head: 1. Intracranial atherosclerotic disease with multifocal stenoses, most notably as follows. 2. Apparent severe focal stenosis within a proximal M2 right MCA branch vessel. 3. Moderate to moderately severe focal stenosis within the mid P2 left posterior cerebral artery. 4. Sites of up to moderate stenosis within the proximal  P2 right PCA. MRA neck: 1. The bilateral common and internal carotid arteries are patent within the neck without significant stenosis. 2. The vertebral arteries are patent within the neck bilaterally with antegrade flow. 3. There is limited assessment for stenoses within the non dominant cervical right vertebral artery due to developmental small vessel size. 4. There are apparent moderate stenoses within the V1 and V2 segments of the dominant cervical left vertebral artery on the post-contrast imaging. However, the stenoses may be accentuated by motion artifact. Electronically Signed   By: Kellie Simmering  DO   On: 05/22/2019 22:31   MR Angiogram Neck W or Wo Contrast  Result Date: 05/22/2019 CLINICAL DATA:  Focal neuro deficit, greater than 6 hours, stroke suspected. Additional history provided: Patient reports dizziness, double vision, difficulty walking due to difficulty keeping balance, onset last night, symptoms continue. EXAM: MR HEAD WITHOUT CONTRAST MR CIRCLE OF WILLIS WITHOUT CONTRAST MRA OF THE NECK WITHOUT AND WITH CONTRAST TECHNIQUE: Multiplanar, multiecho pulse sequences of the brain, circle of willis and surrounding structures were obtained without intravenous contrast. Angiographic images of the neck were obtained using MRA technique without and with intravenous contrast. CONTRAST:  5mL GADAVIST GADOBUTROL 1 MMOL/ML IV SOLN COMPARISON:  Head CT 05/22/2019, brain MRI 06/24/2012 FINDINGS: MR HEAD FINDINGS Brain: There is a 2 cm focus of restricted diffusion within the periventricular left temporal lobe, along the atrium and temporal horn of the left lateral ventricle. Findings are consistent with acute infarction. Corresponding T2/FLAIR hyperintensity at this site. There is no evidence of acute infarct elsewhere within the brain. No evidence of intracranial mass. No midline shift or extra-axial fluid collection. No chronic intracranial blood products. Mild for age scattered T2/FLAIR hyperintensity within the cerebral white matter has progressed from MRI 06/24/2012. Findings are nonspecific, but consistent with chronic small vessel ischemic disease. Sever tiny chronic lacunar infarcts within the left cerebellar hemisphere were not present on this prior MRI. Stable, mild generalized parenchymal atrophy. Vascular: Reported below. Skull and upper cervical spine: No focal marrow lesion. Sinuses/Orbits: Visualized orbits demonstrate no acute abnormality. Mild ethmoid sinus mucosal thickening. Trace fluid within bilateral mastoid air cells. MR CIRCLE OF WILLIS FINDINGS The intracranial internal carotid  arteries are patent without significant stenosis. The M1 middle cerebral arteries are patent without significant stenosis. Apparent high-grade focal stenosis within a proximal M2 right MCA branch vessel (series 1045, image 13). No other high-grade proximal M2 stenosis is identified. The anterior cerebral arteries are patent without high-grade proximal stenosis. No intracranial aneurysm is identified. The non dominant intracranial right vertebral artery is patent and appears to terminate predominantly as the right PICA. The dominant intracranial left vertebral artery is patent without significant stenosis, as is the basilar artery. Predominantly fetal origin of the right posterior cerebral artery. The posterior cerebral arteries are patent bilaterally. Sites of up to moderate stenosis within the proximal P2 right PCA. Moderate to moderately severe focal stenosis within the mid P2 left PCA (series 1031, image 5). Atherosclerotic irregularity of distal left PCA branches is also noted. A small left posterior communicating artery is present. MRA NECK FINDINGS Standard aortic branching. No hemodynamically significant stenosis of the innominate or proximal subclavian arteries. The bilateral common and internal carotid arteries are patent without significant stenosis (50% or greater). The vertebral arteries are patent within the neck bilaterally with antegrade flow. There is limited assessment for stenoses within the non dominant cervical right vertebral artery due to developmental small vessel size. There are multifocal sites of apparent moderate  narrowing within the V1 and V2 segments of the dominant cervical left vertebral artery on the postcontrast imaging. However, these stenoses may be accentuated by motion artifact. IMPRESSION: MRI brain: 1. 2 cm acute infarct within the periventricular left temporal lobe. 2. Mild chronic small vessel ischemic changes within the cerebral white matter, progressed as compared MRI  06/24/2012. Several tiny chronic lacunar infarcts within the left cerebellar hemisphere were not present on this prior exam. 3. Stable, mild generalized parenchymal atrophy. 4. Mild ethmoid sinus mucosal thickening. Trace fluid within bilateral mastoid air cells. MRA head: 1. Intracranial atherosclerotic disease with multifocal stenoses, most notably as follows. 2. Apparent severe focal stenosis within a proximal M2 right MCA branch vessel. 3. Moderate to moderately severe focal stenosis within the mid P2 left posterior cerebral artery. 4. Sites of up to moderate stenosis within the proximal P2 right PCA. MRA neck: 1. The bilateral common and internal carotid arteries are patent within the neck without significant stenosis. 2. The vertebral arteries are patent within the neck bilaterally with antegrade flow. 3. There is limited assessment for stenoses within the non dominant cervical right vertebral artery due to developmental small vessel size. 4. There are apparent moderate stenoses within the V1 and V2 segments of the dominant cervical left vertebral artery on the post-contrast imaging. However, the stenoses may be accentuated by motion artifact. Electronically Signed   By: Kellie Simmering DO   On: 05/22/2019 22:31   MR BRAIN WO CONTRAST  Result Date: 05/22/2019 CLINICAL DATA:  Focal neuro deficit, greater than 6 hours, stroke suspected. Additional history provided: Patient reports dizziness, double vision, difficulty walking due to difficulty keeping balance, onset last night, symptoms continue. EXAM: MR HEAD WITHOUT CONTRAST MR CIRCLE OF WILLIS WITHOUT CONTRAST MRA OF THE NECK WITHOUT AND WITH CONTRAST TECHNIQUE: Multiplanar, multiecho pulse sequences of the brain, circle of willis and surrounding structures were obtained without intravenous contrast. Angiographic images of the neck were obtained using MRA technique without and with intravenous contrast. CONTRAST:  34mL GADAVIST GADOBUTROL 1 MMOL/ML IV SOLN  COMPARISON:  Head CT 05/22/2019, brain MRI 06/24/2012 FINDINGS: MR HEAD FINDINGS Brain: There is a 2 cm focus of restricted diffusion within the periventricular left temporal lobe, along the atrium and temporal horn of the left lateral ventricle. Findings are consistent with acute infarction. Corresponding T2/FLAIR hyperintensity at this site. There is no evidence of acute infarct elsewhere within the brain. No evidence of intracranial mass. No midline shift or extra-axial fluid collection. No chronic intracranial blood products. Mild for age scattered T2/FLAIR hyperintensity within the cerebral white matter has progressed from MRI 06/24/2012. Findings are nonspecific, but consistent with chronic small vessel ischemic disease. Sever tiny chronic lacunar infarcts within the left cerebellar hemisphere were not present on this prior MRI. Stable, mild generalized parenchymal atrophy. Vascular: Reported below. Skull and upper cervical spine: No focal marrow lesion. Sinuses/Orbits: Visualized orbits demonstrate no acute abnormality. Mild ethmoid sinus mucosal thickening. Trace fluid within bilateral mastoid air cells. MR CIRCLE OF WILLIS FINDINGS The intracranial internal carotid arteries are patent without significant stenosis. The M1 middle cerebral arteries are patent without significant stenosis. Apparent high-grade focal stenosis within a proximal M2 right MCA branch vessel (series 1045, image 13). No other high-grade proximal M2 stenosis is identified. The anterior cerebral arteries are patent without high-grade proximal stenosis. No intracranial aneurysm is identified. The non dominant intracranial right vertebral artery is patent and appears to terminate predominantly as the right PICA. The dominant intracranial left vertebral artery is  patent without significant stenosis, as is the basilar artery. Predominantly fetal origin of the right posterior cerebral artery. The posterior cerebral arteries are patent  bilaterally. Sites of up to moderate stenosis within the proximal P2 right PCA. Moderate to moderately severe focal stenosis within the mid P2 left PCA (series 1031, image 5). Atherosclerotic irregularity of distal left PCA branches is also noted. A small left posterior communicating artery is present. MRA NECK FINDINGS Standard aortic branching. No hemodynamically significant stenosis of the innominate or proximal subclavian arteries. The bilateral common and internal carotid arteries are patent without significant stenosis (50% or greater). The vertebral arteries are patent within the neck bilaterally with antegrade flow. There is limited assessment for stenoses within the non dominant cervical right vertebral artery due to developmental small vessel size. There are multifocal sites of apparent moderate narrowing within the V1 and V2 segments of the dominant cervical left vertebral artery on the postcontrast imaging. However, these stenoses may be accentuated by motion artifact. IMPRESSION: MRI brain: 1. 2 cm acute infarct within the periventricular left temporal lobe. 2. Mild chronic small vessel ischemic changes within the cerebral white matter, progressed as compared MRI 06/24/2012. Several tiny chronic lacunar infarcts within the left cerebellar hemisphere were not present on this prior exam. 3. Stable, mild generalized parenchymal atrophy. 4. Mild ethmoid sinus mucosal thickening. Trace fluid within bilateral mastoid air cells. MRA head: 1. Intracranial atherosclerotic disease with multifocal stenoses, most notably as follows. 2. Apparent severe focal stenosis within a proximal M2 right MCA branch vessel. 3. Moderate to moderately severe focal stenosis within the mid P2 left posterior cerebral artery. 4. Sites of up to moderate stenosis within the proximal P2 right PCA. MRA neck: 1. The bilateral common and internal carotid arteries are patent within the neck without significant stenosis. 2. The vertebral  arteries are patent within the neck bilaterally with antegrade flow. 3. There is limited assessment for stenoses within the non dominant cervical right vertebral artery due to developmental small vessel size. 4. There are apparent moderate stenoses within the V1 and V2 segments of the dominant cervical left vertebral artery on the post-contrast imaging. However, the stenoses may be accentuated by motion artifact. Electronically Signed   By: Kellie Simmering DO   On: 05/22/2019 22:31    EKG: Independently reviewed.   Assessment/Plan    Acute CVA (cerebrovascular accident) [facility]) -Patient presents with unsteady gait, dizziness and double vision, onset 24 hours prior to presentation and outside the TPA window. --MRI shows acute infarct periventricular left temporal lobe and MRA head and neck shows multifocal stenoses -Aspirin and statins -Allow permissive hypertension for the next 24 hours -Continuous cardiac monitoring, echocardiogram. -Neurology consult -PT OT and speech therapy evaluation    Essential hypertension -Holding antihypertensives for now to allow for permissive hypertension    DVT prophylaxis: Lovenox  Code Status: full code  Family Communication: son, daylen broderick Disposition Plan: Back to previous home environment Consults called: neurology  Status:obs    Athena Masse MD [facility]     05/22/2019, 11:08 PM

## 2019-05-22 NOTE — ED Triage Notes (Signed)
C/O dizziness, double vision, difficulty walking due to difficulty keeping balance -- onset last night while eating dinner at around 2000.  States continues to have all symptoms  Patietn is AAOx3.  Skin warm and dry.  MAE equally and strong. Seen by PCP today and referred to ED for evaluation.  Per patient, PCP heard "something new" in one of his carotid arteries.

## 2019-05-22 NOTE — ED Notes (Signed)
Pt transported to MRI 

## 2019-05-23 ENCOUNTER — Encounter: Payer: Self-pay | Admitting: Family Medicine

## 2019-05-23 ENCOUNTER — Observation Stay (HOSPITAL_COMMUNITY)
Admit: 2019-05-23 | Discharge: 2019-05-23 | Disposition: A | Discharge status: Home / Self Care | Payer: Medicare Other | Attending: Internal Medicine | Admitting: Internal Medicine

## 2019-05-23 DIAGNOSIS — I10 Essential (primary) hypertension: Secondary | ICD-10-CM | POA: Diagnosis not present

## 2019-05-23 DIAGNOSIS — I129 Hypertensive chronic kidney disease with stage 1 through stage 4 chronic kidney disease, or unspecified chronic kidney disease: Secondary | ICD-10-CM | POA: Diagnosis present

## 2019-05-23 DIAGNOSIS — I639 Cerebral infarction, unspecified: Secondary | ICD-10-CM | POA: Diagnosis present

## 2019-05-23 DIAGNOSIS — Z87891 Personal history of nicotine dependence: Secondary | ICD-10-CM | POA: Diagnosis not present

## 2019-05-23 DIAGNOSIS — R29700 NIHSS score 0: Secondary | ICD-10-CM | POA: Diagnosis present

## 2019-05-23 DIAGNOSIS — E785 Hyperlipidemia, unspecified: Secondary | ICD-10-CM | POA: Diagnosis present

## 2019-05-23 DIAGNOSIS — I6389 Other cerebral infarction: Secondary | ICD-10-CM

## 2019-05-23 DIAGNOSIS — H532 Diplopia: Secondary | ICD-10-CM | POA: Diagnosis present

## 2019-05-23 DIAGNOSIS — Z20822 Contact with and (suspected) exposure to covid-19: Secondary | ICD-10-CM | POA: Diagnosis present

## 2019-05-23 DIAGNOSIS — N189 Chronic kidney disease, unspecified: Secondary | ICD-10-CM | POA: Diagnosis present

## 2019-05-23 LAB — LIPID PANEL
Cholesterol: 244 mg/dL — ABNORMAL HIGH (ref 0–200)
HDL: 43 mg/dL (ref >40)
LDL Cholesterol: 164 mg/dL — ABNORMAL HIGH (ref 0–99)
Total CHOL/HDL Ratio: 5.7 RATIO
Triglycerides: 186 mg/dL — ABNORMAL HIGH (ref <150)
VLDL: 37 mg/dL (ref 0–40)

## 2019-05-23 LAB — ECHOCARDIOGRAM COMPLETE
Height: 71 in
Weight: 2400 oz

## 2019-05-23 LAB — SARS CORONAVIRUS 2 (TAT 6-24 HRS): SARS Coronavirus 2: NEGATIVE

## 2019-05-23 MED ORDER — CLOPIDOGREL BISULFATE 75 MG PO TABS
75.0000 mg | ORAL_TABLET | Freq: Every day | ORAL | Status: DC
  Administered 2019-05-23 – 2019-05-24 (×2): 75 mg via ORAL
  Filled 2019-05-23 (×3): qty 1

## 2019-05-23 MED ORDER — ATORVASTATIN CALCIUM 20 MG PO TABS
80.0000 mg | ORAL_TABLET | Freq: Every day | ORAL | Status: DC
  Administered 2019-05-23 – 2019-05-24 (×2): 80 mg via ORAL
  Filled 2019-05-23 (×3): qty 4

## 2019-05-23 MED ORDER — SUCRALFATE 1 G PO TABS: 1.0000 g | ORAL_TABLET | Freq: Three times a day (TID) | ORAL | Status: DC | PRN

## 2019-05-23 NOTE — ED Notes (Signed)
Pt provided with meal tray at this time. This RN to bedside, introduced self to patient at this time. Neuro assessment performed by this RN. VS obtained. This RN spoke with patient at length regarding visitor policy. This RN apologized at length due to visitor restrictions. Pt states understanding. Call bell remains within reach. Pt denies further needs.

## 2019-05-23 NOTE — Consult Note (Addendum)
Requesting Physician: Shahmehdi    Chief Complaint: Diplopia, difficulty with gait  I have been asked by Dr. Roger Shelter to see this patient in consultation for stroke.  HPI: Caleb Reynolds is an 84 y.o. male with medical history significant for hypertension, who presented to the emergency room with dizziness, unsteady gait and double vision that started around 8 PM on 05/21/2019.  He went to see his PCP due to persistent symptoms on yesterday who advised him to come into the emergency room.  Symptoms have improved since presentation.   Initial NIHSS of 0.  Date last known well: 05/21/2019 Time last known well: Time: 20:00 tPA Given: No: Outside time window  Past Medical History:  Diagnosis Date  . Chronic kidney disease    kidney stones  . Hypertension     Past Surgical History:  Procedure Laterality Date  . COLONOSCOPY WITH PROPOFOL N/A 03/11/2016   Procedure: COLONOSCOPY WITH PROPOFOL;  Surgeon: Manya Silvas, MD;  Location: [facility] ENDOSCOPY;  Service: Endoscopy;  Laterality: N/A;  . ESOPHAGOGASTRODUODENOSCOPY (EGD) WITH PROPOFOL N/A 03/11/2016   Procedure: ESOPHAGOGASTRODUODENOSCOPY (EGD) WITH PROPOFOL;  Surgeon: Manya Silvas, MD;  Location: [facility] ENDOSCOPY;  Service: Endoscopy;  Laterality: N/A;    Family history: Son with CAD s/p MI.  Daughter alive and well.  Social History:  reports that he has quit smoking. He has never used smokeless tobacco. He reports that he does not drink alcohol or use drugs.  Allergies: No Known Allergies  Medications: I have reviewed the patient's current medications. Prior to Admission: Prior to Admission medications   Medication Sig Start Date End Date Taking? Authorizing Provider  acetaminophen (TYLENOL) 650 MG CR tablet Take 650 mg by mouth daily.   Yes [provider]  amLODipine (NORVASC) 10 MG tablet Take 10 mg by mouth daily.   Yes [provider]  loratadine (CLARITIN) 10 MG tablet Take 10 mg by mouth daily.   Yes  [provider]  pantoprazole (PROTONIX) 40 MG tablet Take 40 mg by mouth 2 (two) times daily before a meal.    [provider]  sucralfate (CARAFATE) 1 g tablet Take 1 g by mouth 4 (four) times daily.    [provider]    ROS: History obtained from the patient  General ROS: negative for - chills, fatigue, fever, night sweats, weight gain or weight loss Psychological ROS: negative for - behavioral disorder, hallucinations, memory difficulties, mood swings or suicidal ideation Ophthalmic ROS: as noted in HPI ENT ROS: negative for - epistaxis, nasal discharge, oral lesions, sore throat, tinnitus or vertigo Allergy and Immunology ROS: negative for - hives or itchy/watery eyes Hematological and Lymphatic ROS: negative for - bleeding problems, bruising or swollen lymph nodes Endocrine ROS: negative for - galactorrhea, hair pattern changes, polydipsia/polyuria or temperature intolerance Respiratory ROS: negative for - cough, hemoptysis, shortness of breath or wheezing Cardiovascular ROS: negative for - chest pain, dyspnea on exertion, edema or irregular heartbeat Gastrointestinal ROS: negative for - abdominal pain, diarrhea, hematemesis, nausea/vomiting or stool incontinence Genito-Urinary ROS: negative for - dysuria, hematuria, incontinence or urinary frequency/urgency Musculoskeletal ROS: negative for - joint swelling or muscular weakness Neurological ROS: as noted in HPI Dermatological ROS: negative for rash and skin lesion changes  Physical Examination: Blood pressure 134/70, pulse 81, temperature 98.4 F (36.9 C), temperature source Oral, resp. rate 20, height 5\' 11"  (1.803 m), weight 68 kg, SpO2 92 %.  HEENT-  Normocephalic, no lesions, without obvious abnormality.  Normal external eye and  conjunctiva.  Normal TM's bilaterally.  Normal auditory canals and external ears. Normal external nose, mucus membranes and septum.  Normal pharynx. Cardiovascular- S1, S2  normal, pulses palpable throughout   Lungs- chest clear, no wheezing, rales, normal symmetric air entry Abdomen- soft, non-tender; bowel sounds normal; no masses,  no organomegaly Extremities- no edema Lymph-no adenopathy palpable Musculoskeletal-no joint tenderness, deformity or swelling Skin-warm and dry, no hyperpigmentation, vitiligo, or suspicious lesions  Neurological Examination   Mental Status: Alert, oriented, thought content appropriate.  Speech fluent without evidence of aphasia.  Able to follow 3 step commands without difficulty. Cranial Nerves: II: Discs flat bilaterally; Blurred vision from the left eye.  Intact vision on the right.  Pupils equal, round, reactive to light and accommodation III,IV, VI: ptosis not present, extra-ocular motions intact bilaterally V,VII: smile symmetric, facial light touch sensation normal bilaterally VIII: hearing normal bilaterally IX,X: gag reflex present XI: bilateral shoulder shrug XII: midline tongue extension Motor: Right : Upper extremity   5/5    Left:     Upper extremity   5/5  Lower extremity   5/5     Lower extremity   5/5 Tone and bulk:normal tone throughout; no atrophy noted Sensory: Pinprick and light touch intact throughout, bilaterally Deep Tendon Reflexes: Symmetric throughout Plantars: Right: mute   Left: mute Cerebellar: Mild dysmetria with finger-to-nose and heel-to-shin testing on the right Gait: not tested due to safety concerns    Laboratory Studies:  Basic Metabolic Panel: Recent Labs  Lab 05/22/19 1221  NA 138  K 3.9  CL 101  CO2 28  GLUCOSE 111*  BUN 8  CREATININE 0.68  CALCIUM 8.8*    Liver Function Tests: Recent Labs  Lab 05/22/19 1221  AST 21  ALT 17  ALKPHOS 25*  BILITOT 1.6*  PROT 7.6  ALBUMIN 4.5   No results for input(s): LIPASE, AMYLASE in the last 168 hours. No results for input(s): AMMONIA in the last 168 hours.  CBC: Recent Labs  Lab 05/22/19 1221  WBC 5.2  NEUTROABS 3.7   HGB 14.6  HCT 44.2  MCV 91.5  PLT 300    Cardiac Enzymes: No results for input(s): CKTOTAL, CKMB, CKMBINDEX, TROPONINI in the last 168 hours.  BNP: Invalid input(s): POCBNP  CBG: No results for input(s): GLUCAP in the last 168 hours.  Microbiology: Results for orders placed or performed during the hospital encounter of 05/22/19  SARS CORONAVIRUS 2 (TAT 6-24 HRS) Nasopharyngeal Nasopharyngeal Swab     Status: None   Collection Time: 05/22/19 11:01 PM   Specimen: Nasopharyngeal Swab  Result Value Ref Range Status   SARS Coronavirus 2 NEGATIVE NEGATIVE Final    Comment: (NOTE) SARS-CoV-2 target nucleic acids are NOT DETECTED. The SARS-CoV-2 RNA is generally detectable in upper and lower respiratory specimens during the acute phase of infection. Negative results do not preclude SARS-CoV-2 infection, do not rule out co-infections with other pathogens, and should not be used as the sole basis for treatment or other patient management decisions. Negative results must be combined with clinical observations, patient history, and epidemiological information. The expected result is Negative. Fact Sheet for Patients: SugarRoll.be Fact Sheet for Healthcare Providers: https://www.woods-mathews.com/ This test is not yet approved or cleared by the [place] FDA and  has been authorized for detection and/or diagnosis of SARS-CoV-2 by FDA under an Emergency Use Authorization (EUA). This EUA will remain  in effect (meaning this test can be used) for the duration of the COVID-19 declaration under Section 56  4(b)(1) of the Act, 21 U.S.C. section 360bbb-3(b)(1), unless the authorization is terminated or revoked sooner. Performed at [facility], [place] [address] [place], [place] [address]     Coagulation Studies: Recent Labs    05/22/19 1221  LABPROT 13.2  INR 1.0    Urinalysis: No results for input(s): COLORURINE, LABSPEC, PHURINE,  GLUCOSEU, HGBUR, BILIRUBINUR, KETONESUR, PROTEINUR, UROBILINOGEN, NITRITE, LEUKOCYTESUR in the last 168 hours.  Invalid input(s): APPERANCEUR  Lipid Panel:    Component Value Date/Time   CHOL 244 (H) 05/23/2019 0555   TRIG 186 (H) 05/23/2019 0555   HDL 43 05/23/2019 0555   CHOLHDL 5.7 05/23/2019 0555   VLDL 37 05/23/2019 0555   LDLCALC 164 (H) 05/23/2019 0555    HgbA1C: No results found for: HGBA1C  Urine Drug Screen:  No results found for: LABOPIA, COCAINSCRNUR, LABBENZ, AMPHETMU, THCU, LABBARB  Alcohol Level: No results for input(s): ETH in the last 168 hours.  Other results: EKG: normal sinus rhythm at 90 bpm.  Imaging: CT HEAD WO CONTRAST  Result Date: 05/22/2019 CLINICAL DATA:  Acute neuro deficit. Double vision and dizziness. Rule out stroke EXAM: CT HEAD WITHOUT CONTRAST TECHNIQUE: Contiguous axial images were obtained from the base of the skull through the vertex without intravenous contrast. COMPARISON:  CT head 06/24/2012 FINDINGS: Brain: No evidence of acute infarction, hemorrhage, hydrocephalus, extra-axial collection or mass lesion/mass effect. Mild chronic microvascular ischemic change in the white matter. Cerebral volume normal for age. Vascular: Atherosclerotic calcification. Negative for hyperdense vessel Skull: Negative Sinuses/Orbits: Negative Other: None IMPRESSION: No acute abnormality. Electronically Signed   By: Franchot Gallo M.D.   On: 05/22/2019 12:40   MR ANGIO HEAD WO CONTRAST  Result Date: 05/22/2019 CLINICAL DATA:  Focal neuro deficit, greater than 6 hours, stroke suspected. Additional history provided: Patient reports dizziness, double vision, difficulty walking due to difficulty keeping balance, onset last night, symptoms continue. EXAM: MR HEAD WITHOUT CONTRAST MR CIRCLE OF WILLIS WITHOUT CONTRAST MRA OF THE NECK WITHOUT AND WITH CONTRAST TECHNIQUE: Multiplanar, multiecho pulse sequences of the brain, circle of willis and surrounding structures were  obtained without intravenous contrast. Angiographic images of the neck were obtained using MRA technique without and with intravenous contrast. CONTRAST:  84mL GADAVIST GADOBUTROL 1 MMOL/ML IV SOLN COMPARISON:  Head CT 05/22/2019, brain MRI 06/24/2012 FINDINGS: MR HEAD FINDINGS Brain: There is a 2 cm focus of restricted diffusion within the periventricular left temporal lobe, along the atrium and temporal horn of the left lateral ventricle. Findings are consistent with acute infarction. Corresponding T2/FLAIR hyperintensity at this site. There is no evidence of acute infarct elsewhere within the brain. No evidence of intracranial mass. No midline shift or extra-axial fluid collection. No chronic intracranial blood products. Mild for age scattered T2/FLAIR hyperintensity within the cerebral white matter has progressed from MRI 06/24/2012. Findings are nonspecific, but consistent with chronic small vessel ischemic disease. Sever tiny chronic lacunar infarcts within the left cerebellar hemisphere were not present on this prior MRI. Stable, mild generalized parenchymal atrophy. Vascular: Reported below. Skull and upper cervical spine: No focal marrow lesion. Sinuses/Orbits: Visualized orbits demonstrate no acute abnormality. Mild ethmoid sinus mucosal thickening. Trace fluid within bilateral mastoid air cells. MR CIRCLE OF WILLIS FINDINGS The intracranial internal carotid arteries are patent without significant stenosis. The M1 middle cerebral arteries are patent without significant stenosis. Apparent high-grade focal stenosis within a proximal M2 right MCA branch vessel (series 1045, image 13). No other high-grade proximal M2 stenosis is identified. The anterior cerebral  arteries are patent without high-grade proximal stenosis. No intracranial aneurysm is identified. The non dominant intracranial right vertebral artery is patent and appears to terminate predominantly as the right PICA. The dominant intracranial left  vertebral artery is patent without significant stenosis, as is the basilar artery. Predominantly fetal origin of the right posterior cerebral artery. The posterior cerebral arteries are patent bilaterally. Sites of up to moderate stenosis within the proximal P2 right PCA. Moderate to moderately severe focal stenosis within the mid P2 left PCA (series 1031, image 5). Atherosclerotic irregularity of distal left PCA branches is also noted. A small left posterior communicating artery is present. MRA NECK FINDINGS Standard aortic branching. No hemodynamically significant stenosis of the innominate or proximal subclavian arteries. The bilateral common and internal carotid arteries are patent without significant stenosis (50% or greater). The vertebral arteries are patent within the neck bilaterally with antegrade flow. There is limited assessment for stenoses within the non dominant cervical right vertebral artery due to developmental small vessel size. There are multifocal sites of apparent moderate narrowing within the V1 and V2 segments of the dominant cervical left vertebral artery on the postcontrast imaging. However, these stenoses may be accentuated by motion artifact. IMPRESSION: MRI brain: 1. 2 cm acute infarct within the periventricular left temporal lobe. 2. Mild chronic small vessel ischemic changes within the cerebral white matter, progressed as compared MRI 06/24/2012. Several tiny chronic lacunar infarcts within the left cerebellar hemisphere were not present on this prior exam. 3. Stable, mild generalized parenchymal atrophy. 4. Mild ethmoid sinus mucosal thickening. Trace fluid within bilateral mastoid air cells. MRA head: 1. Intracranial atherosclerotic disease with multifocal stenoses, most notably as follows. 2. Apparent severe focal stenosis within a proximal M2 right MCA branch vessel. 3. Moderate to moderately severe focal stenosis within the mid P2 left posterior cerebral artery. 4. Sites of up to  moderate stenosis within the proximal P2 right PCA. MRA neck: 1. The bilateral common and internal carotid arteries are patent within the neck without significant stenosis. 2. The vertebral arteries are patent within the neck bilaterally with antegrade flow. 3. There is limited assessment for stenoses within the non dominant cervical right vertebral artery due to developmental small vessel size. 4. There are apparent moderate stenoses within the V1 and V2 segments of the dominant cervical left vertebral artery on the post-contrast imaging. However, the stenoses may be accentuated by motion artifact. Electronically Signed   By: Kellie Simmering DO   On: 05/22/2019 22:31   MR Angiogram Neck W or Wo Contrast  Result Date: 05/22/2019 CLINICAL DATA:  Focal neuro deficit, greater than 6 hours, stroke suspected. Additional history provided: Patient reports dizziness, double vision, difficulty walking due to difficulty keeping balance, onset last night, symptoms continue. EXAM: MR HEAD WITHOUT CONTRAST MR CIRCLE OF WILLIS WITHOUT CONTRAST MRA OF THE NECK WITHOUT AND WITH CONTRAST TECHNIQUE: Multiplanar, multiecho pulse sequences of the brain, circle of willis and surrounding structures were obtained without intravenous contrast. Angiographic images of the neck were obtained using MRA technique without and with intravenous contrast. CONTRAST:  4mL GADAVIST GADOBUTROL 1 MMOL/ML IV SOLN COMPARISON:  Head CT 05/22/2019, brain MRI 06/24/2012 FINDINGS: MR HEAD FINDINGS Brain: There is a 2 cm focus of restricted diffusion within the periventricular left temporal lobe, along the atrium and temporal horn of the left lateral ventricle. Findings are consistent with acute infarction. Corresponding T2/FLAIR hyperintensity at this site. There is no evidence of acute infarct elsewhere within the brain. No evidence of intracranial  mass. No midline shift or extra-axial fluid collection. No chronic intracranial blood products. Mild for age  scattered T2/FLAIR hyperintensity within the cerebral white matter has progressed from MRI 06/24/2012. Findings are nonspecific, but consistent with chronic small vessel ischemic disease. Sever tiny chronic lacunar infarcts within the left cerebellar hemisphere were not present on this prior MRI. Stable, mild generalized parenchymal atrophy. Vascular: Reported below. Skull and upper cervical spine: No focal marrow lesion. Sinuses/Orbits: Visualized orbits demonstrate no acute abnormality. Mild ethmoid sinus mucosal thickening. Trace fluid within bilateral mastoid air cells. MR CIRCLE OF WILLIS FINDINGS The intracranial internal carotid arteries are patent without significant stenosis. The M1 middle cerebral arteries are patent without significant stenosis. Apparent high-grade focal stenosis within a proximal M2 right MCA branch vessel (series 1045, image 13). No other high-grade proximal M2 stenosis is identified. The anterior cerebral arteries are patent without high-grade proximal stenosis. No intracranial aneurysm is identified. The non dominant intracranial right vertebral artery is patent and appears to terminate predominantly as the right PICA. The dominant intracranial left vertebral artery is patent without significant stenosis, as is the basilar artery. Predominantly fetal origin of the right posterior cerebral artery. The posterior cerebral arteries are patent bilaterally. Sites of up to moderate stenosis within the proximal P2 right PCA. Moderate to moderately severe focal stenosis within the mid P2 left PCA (series 1031, image 5). Atherosclerotic irregularity of distal left PCA branches is also noted. A small left posterior communicating artery is present. MRA NECK FINDINGS Standard aortic branching. No hemodynamically significant stenosis of the innominate or proximal subclavian arteries. The bilateral common and internal carotid arteries are patent without significant stenosis (50% or greater). The  vertebral arteries are patent within the neck bilaterally with antegrade flow. There is limited assessment for stenoses within the non dominant cervical right vertebral artery due to developmental small vessel size. There are multifocal sites of apparent moderate narrowing within the V1 and V2 segments of the dominant cervical left vertebral artery on the postcontrast imaging. However, these stenoses may be accentuated by motion artifact. IMPRESSION: MRI brain: 1. 2 cm acute infarct within the periventricular left temporal lobe. 2. Mild chronic small vessel ischemic changes within the cerebral white matter, progressed as compared MRI 06/24/2012. Several tiny chronic lacunar infarcts within the left cerebellar hemisphere were not present on this prior exam. 3. Stable, mild generalized parenchymal atrophy. 4. Mild ethmoid sinus mucosal thickening. Trace fluid within bilateral mastoid air cells. MRA head: 1. Intracranial atherosclerotic disease with multifocal stenoses, most notably as follows. 2. Apparent severe focal stenosis within a proximal M2 right MCA branch vessel. 3. Moderate to moderately severe focal stenosis within the mid P2 left posterior cerebral artery. 4. Sites of up to moderate stenosis within the proximal P2 right PCA. MRA neck: 1. The bilateral common and internal carotid arteries are patent within the neck without significant stenosis. 2. The vertebral arteries are patent within the neck bilaterally with antegrade flow. 3. There is limited assessment for stenoses within the non dominant cervical right vertebral artery due to developmental small vessel size. 4. There are apparent moderate stenoses within the V1 and V2 segments of the dominant cervical left vertebral artery on the post-contrast imaging. However, the stenoses may be accentuated by motion artifact. Electronically Signed   By: Kellie Simmering DO   On: 05/22/2019 22:31   MR BRAIN WO CONTRAST  Result Date: 05/22/2019 CLINICAL DATA:   Focal neuro deficit, greater than 6 hours, stroke suspected. Additional history provided: Patient reports  dizziness, double vision, difficulty walking due to difficulty keeping balance, onset last night, symptoms continue. EXAM: MR HEAD WITHOUT CONTRAST MR CIRCLE OF WILLIS WITHOUT CONTRAST MRA OF THE NECK WITHOUT AND WITH CONTRAST TECHNIQUE: Multiplanar, multiecho pulse sequences of the brain, circle of willis and surrounding structures were obtained without intravenous contrast. Angiographic images of the neck were obtained using MRA technique without and with intravenous contrast. CONTRAST:  78mL GADAVIST GADOBUTROL 1 MMOL/ML IV SOLN COMPARISON:  Head CT 05/22/2019, brain MRI 06/24/2012 FINDINGS: MR HEAD FINDINGS Brain: There is a 2 cm focus of restricted diffusion within the periventricular left temporal lobe, along the atrium and temporal horn of the left lateral ventricle. Findings are consistent with acute infarction. Corresponding T2/FLAIR hyperintensity at this site. There is no evidence of acute infarct elsewhere within the brain. No evidence of intracranial mass. No midline shift or extra-axial fluid collection. No chronic intracranial blood products. Mild for age scattered T2/FLAIR hyperintensity within the cerebral white matter has progressed from MRI 06/24/2012. Findings are nonspecific, but consistent with chronic small vessel ischemic disease. Sever tiny chronic lacunar infarcts within the left cerebellar hemisphere were not present on this prior MRI. Stable, mild generalized parenchymal atrophy. Vascular: Reported below. Skull and upper cervical spine: No focal marrow lesion. Sinuses/Orbits: Visualized orbits demonstrate no acute abnormality. Mild ethmoid sinus mucosal thickening. Trace fluid within bilateral mastoid air cells. MR CIRCLE OF WILLIS FINDINGS The intracranial internal carotid arteries are patent without significant stenosis. The M1 middle cerebral arteries are patent without significant  stenosis. Apparent high-grade focal stenosis within a proximal M2 right MCA branch vessel (series 1045, image 13). No other high-grade proximal M2 stenosis is identified. The anterior cerebral arteries are patent without high-grade proximal stenosis. No intracranial aneurysm is identified. The non dominant intracranial right vertebral artery is patent and appears to terminate predominantly as the right PICA. The dominant intracranial left vertebral artery is patent without significant stenosis, as is the basilar artery. Predominantly fetal origin of the right posterior cerebral artery. The posterior cerebral arteries are patent bilaterally. Sites of up to moderate stenosis within the proximal P2 right PCA. Moderate to moderately severe focal stenosis within the mid P2 left PCA (series 1031, image 5). Atherosclerotic irregularity of distal left PCA branches is also noted. A small left posterior communicating artery is present. MRA NECK FINDINGS Standard aortic branching. No hemodynamically significant stenosis of the innominate or proximal subclavian arteries. The bilateral common and internal carotid arteries are patent without significant stenosis (50% or greater). The vertebral arteries are patent within the neck bilaterally with antegrade flow. There is limited assessment for stenoses within the non dominant cervical right vertebral artery due to developmental small vessel size. There are multifocal sites of apparent moderate narrowing within the V1 and V2 segments of the dominant cervical left vertebral artery on the postcontrast imaging. However, these stenoses may be accentuated by motion artifact. IMPRESSION: MRI brain: 1. 2 cm acute infarct within the periventricular left temporal lobe. 2. Mild chronic small vessel ischemic changes within the cerebral white matter, progressed as compared MRI 06/24/2012. Several tiny chronic lacunar infarcts within the left cerebellar hemisphere were not present on this prior  exam. 3. Stable, mild generalized parenchymal atrophy. 4. Mild ethmoid sinus mucosal thickening. Trace fluid within bilateral mastoid air cells. MRA head: 1. Intracranial atherosclerotic disease with multifocal stenoses, most notably as follows. 2. Apparent severe focal stenosis within a proximal M2 right MCA branch vessel. 3. Moderate to moderately severe focal stenosis within the mid P2  left posterior cerebral artery. 4. Sites of up to moderate stenosis within the proximal P2 right PCA. MRA neck: 1. The bilateral common and internal carotid arteries are patent within the neck without significant stenosis. 2. The vertebral arteries are patent within the neck bilaterally with antegrade flow. 3. There is limited assessment for stenoses within the non dominant cervical right vertebral artery due to developmental small vessel size. 4. There are apparent moderate stenoses within the V1 and V2 segments of the dominant cervical left vertebral artery on the post-contrast imaging. However, the stenoses may be accentuated by motion artifact. Electronically Signed   By: Kellie Simmering DO   On: 05/22/2019 22:31   ECHOCARDIOGRAM COMPLETE  Result Date: 05/23/2019    ECHOCARDIOGRAM REPORT   Patient Name:   Caleb Reynolds Date of Exam: 05/23/2019 Medical Rec #:  [id number]         Height:       71.0 in Accession #:    [id number]        Weight:       150.0 lb Date of Birth:  Jul 22, 1935         BSA:          1.866 m Patient Age:    89 years          BP:           153/77 mmHg Patient Gender: M                 HR:           76 bpm. Exam Location:  [facility] Procedure: Color Doppler and Cardiac Doppler Indications:     I163.9 Stroke  History:         Patient has no prior history of Echocardiogram examinations.                  Risk Factors:Former Smoker and Hypertension.  Sonographer:     Charmayne Sheer RDCS (AE) Referring Phys:  [id number] Athena Masse Diagnosing Phys: Nelva Bush MD IMPRESSIONS  1. Left ventricular ejection fraction, by  estimation, is 55 to 60%. The left ventricle has normal function. The left ventricle has no regional wall motion abnormalities. Left ventricular diastolic parameters are consistent with Grade I diastolic dysfunction (impaired relaxation).  2. Right ventricular systolic function is normal. The right ventricular size is normal. Tricuspid regurgitation signal is inadequate for assessing PA pressure.  3. The mitral valve is normal in structure. Trivial mitral valve regurgitation. No evidence of mitral stenosis.  4. The aortic valve is tricuspid. Aortic valve regurgitation is not visualized. Mild aortic valve sclerosis is present, with no evidence of aortic valve stenosis. FINDINGS  Left Ventricle: Left ventricular ejection fraction, by estimation, is 55 to 60%. The left ventricle has normal function. The left ventricle has no regional wall motion abnormalities. The left ventricular internal cavity size was normal in size. There is  no left ventricular hypertrophy. Left ventricular diastolic parameters are consistent with Grade I diastolic dysfunction (impaired relaxation). Right Ventricle: The right ventricular size is normal. No increase in right ventricular wall thickness. Right ventricular systolic function is normal. Tricuspid regurgitation signal is inadequate for assessing PA pressure. Left Atrium: Left atrial size was normal in size. Right Atrium: Right atrial size was normal in size. Prominent Eustachian valve. Pericardium: There is no evidence of pericardial effusion. Mitral Valve: The mitral valve is normal in structure. Trivial mitral valve regurgitation. No evidence of mitral valve stenosis. MV peak gradient, 4.8  mmHg. The mean mitral valve gradient is 2.0 mmHg. Tricuspid Valve: The tricuspid valve is normal in structure. Tricuspid valve regurgitation is trivial. Aortic Valve: The aortic valve is tricuspid. . There is mild thickening of the aortic valve. Aortic valve regurgitation is not visualized. Mild  aortic valve sclerosis is present, with no evidence of aortic valve stenosis. There is mild thickening of the aortic valve. Aortic valve mean gradient measures 4.0 mmHg. Aortic valve peak gradient measures 9.7 mmHg. Aortic valve area, by VTI measures 1.85 cm. Pulmonic Valve: The pulmonic valve was not well visualized. Pulmonic valve regurgitation is not visualized. No evidence of pulmonic stenosis. Aorta: The aortic root is normal in size and structure. Pulmonary Artery: The pulmonary artery is not well seen. Venous: The inferior vena cava was not well visualized. IAS/Shunts: No atrial level shunt detected by color flow Doppler.  LEFT VENTRICLE PLAX 2D LVIDd:         4.15 cm  Diastology LVIDs:         2.74 cm  LV e' lateral:   6.85 cm/s LV PW:         0.92 cm  LV E/e' lateral: 9.2 LV IVS:        0.79 cm  LV e' medial:    6.74 cm/s LVOT diam:     2.00 cm  LV E/e' medial:  9.4 LV SV:         52 LV SV Index:   28 LVOT Area:     3.14 cm  RIGHT VENTRICLE RV Basal diam:  3.86 cm LEFT ATRIUM             Index       RIGHT ATRIUM           Index LA diam:        3.10 cm 1.66 cm/m  RA Area:     17.30 cm LA Vol (A2C):   42.7 ml 22.88 ml/m RA Volume:   51.30 ml  27.49 ml/m LA Vol (A4C):   50.3 ml 26.96 ml/m LA Biplane Vol: 47.0 ml 25.19 ml/m  AORTIC VALVE                   PULMONIC VALVE AV Area (Vmax):    1.89 cm    PV Vmax:       0.94 m/s AV Area (Vmean):   2.12 cm    PV Vmean:      58.600 cm/s AV Area (VTI):     1.85 cm    PV VTI:        0.166 m AV Vmax:           156.00 cm/s PV Peak grad:  3.5 mmHg AV Vmean:          91.200 cm/s PV Mean grad:  2.0 mmHg AV VTI:            0.278 m AV Peak Grad:      9.7 mmHg AV Mean Grad:      4.0 mmHg LVOT Vmax:         93.80 cm/s LVOT Vmean:        61.500 cm/s LVOT VTI:          0.164 m LVOT/AV VTI ratio: 0.59  AORTA Ao Root diam: 3.00 cm MITRAL VALVE MV Area (PHT): 4.60 cm    SHUNTS MV Peak grad:  4.8 mmHg    Systemic VTI:  0.16 m MV Mean grad:  2.0 mmHg  Systemic Diam: 2.00  cm MV Vmax:       1.10 m/s MV Vmean:      59.2 cm/s MV Decel Time: 165 msec MV E velocity: 63.10 cm/s MV A velocity: 99.00 cm/s MV E/A ratio:  0.64 Christopher End MD Electronically signed by Nelva Bush MD Signature Date/Time: 05/23/2019/2:01:12 PM    Final     Assessment: 84 y.o. male with medical history significant for hypertension, who presented to the emergency room with dizziness, unsteady gait and double vision.  Patient outside time window for tPA.  Symptoms some improved.  MRI of the brain personally reviewed and shows an acute left temporal lobe periventricular infarct.  Small vessel disease progressed since 2014.  Likely secondary to small vessel disease based on MRA findings.  MRA shows multifocal stenoses with severe stenoses noted at the proximal right M2, mid left P2 and moderate stenosis at the right P2.  Echocardiogram shows no cardiac source of emboli with an EF of 55-60%.  A1c pending, LDL 164. Patient on no antiplatelet or statin therapy prior to admission.    Stroke Risk Factors - hyperlipidemia and hypertension  Plan: 1. HgbA1c pending with target A1c<7.0.  2. Statin for lipid management with target LDL<70. 3. PT consult, OT consult, Speech consult 4. Prophylactic therapy-Dual antiplatelet therapy with ASA 81mg  and Plavix 75mg  for three weeks with change to ASA 81mg  daily alone as monotherapy after that time. 5. NPO until RN stroke swallow screen 6. Telemetry monitoring 7. Frequent neuro checks   Alexis Goodell, MD Neurology [phone] 05/23/2019, 3:06 PM

## 2019-05-23 NOTE — Progress Notes (Signed)
*  PRELIMINARY RESULTS* Echocardiogram 2D Echocardiogram has been performed.  Caleb Reynolds 05/23/2019, 9:06 AM

## 2019-05-23 NOTE — Progress Notes (Signed)
PROGRESS NOTE    Patient: Caleb Reynolds                            PCP: Albina Billet, MD                    DOB: 1935-11-04            DOA: 05/22/2019 [id number]             DOS: 05/23/2019, 1:18 PM   LOS: 0 days   Date of Service: The patient was seen and examined on 05/23/2019  Subjective:   The patient was seen and examined this morning, stable pleasant, no change in speech, reporting double vision has improved.  Denies of having any sensorimotor deficit this morning.  Denies any shortness of breath or chest pain.  Brief Narrative:  Caleb Reynolds is a 84 y.o. male with medical history significant for hypertension, who presented to the emergency room with dizziness, unsteady gait and double vision that started around 8 PM on 05/21/2019. Patient is evaluation revealed with MRI and MRA that he has had acute infarct in preventricular left temporal area, MRI revealed multifocal stenosis.   Assessment & Plan:   Principal Problem:   Acute CVA (cerebrovascular accident) [facility]) Active Problems:   Essential hypertension  Acute CVA (cerebrovascular accident) ([place]) -Patient presented with unsteady gait, dizziness, double vision onset 24 hours prior to admission.  -Patient was outside the window for TPA treatment.  This morning patient has minimal symptoms, remained in bed, dizziness and double vision has improved per patient. Marland Kitchen --MRI shows acute infarct periventricular left temporal lobe and MRA head and neck shows multifocal stenoses -Aspirin and statins, will add Plavix -Allow permissive hypertension for the next 24 hours -Continuous cardiac monitoring, echocardiogram. -Neurology consult -appreciate their input -PT OT and speech therapy evaluation    Hyperlipidemia -Total cholesterol 244, HDL 43, LDL 164 -Initiated full dose of statin   essential hypertension -Holding antihypertensives for now to allow for permissive hypertension -Patient is on home medication of  Norvasc on hold     Nutritional status:         ------------------------------------------------------------------------------------------------------------------------------------------------  DVT prophylaxis: SCD/Compression stockings and Lovenox SQ Code Status:   Code Status: Full Code Family Communication:  No family member present at bedside.  The above findings and plan of care has been discussed with patient (and family )  in detail,  they expressed understanding and agreement of above.   Admission status:   Status is: Observation  The patient remains OBS appropriate and will d/c before 2 midnights.  Dispo: The patient is from: Home              Anticipated d/c is to: Home              Anticipated d/c date is: 1 day              Patient currently is not medically stable to d/c.  ST remains under close observation on the heart monitor, evaluation and work-up for acute stroke, also pending neurology recommendations    Consultants: Neurology  Procedures:      Antimicrobials:  Anti-infectives (From admission, onward)   None       Medication:  .  stroke: mapping our early stages of recovery book   Does not apply Once  . aspirin EC  81 mg Oral Daily  . atorvastatin  80 mg Oral  Daily  . enoxaparin (LOVENOX) injection  40 mg Subcutaneous Q24H    acetaminophen **OR** acetaminophen (TYLENOL) oral liquid 160 mg/5 mL **OR** acetaminophen, senna-docusate   Objective:   Vitals:   05/23/19 1000 05/23/19 1030 05/23/19 1130 05/23/19 1239  BP: (!) 147/65 (!) 159/83 (!) 159/75 (!) 138/98  Pulse: 76  86 91  Resp: (!) 9 (!) 22 (!) 21 20  Temp:      TempSrc:      SpO2: 95%  92% 93%  Weight:      Height:       No intake or output data in the 24 hours ending 05/23/19 1318 Filed Weights   05/22/19 1213  Weight: 68 kg     Examination:   Physical Exam  Constitution:  Alert, cooperative, no distress,  Appears calm and comfortable  Complaining of double vision  has improved,  Psychiatric: Normal and stable mood and affect, cognition intact,   HEENT: Normocephalic, PERRL, otherwise with in Normal limits  Chest:Chest symmetric Cardio vascular:  S1/S2, RRR, No murmure, No Rubs or Gallops  pulmonary: Clear to auscultation bilaterally, respirations unlabored, negative wheezes / crackles Abdomen: Soft, non-tender, non-distended, bowel sounds,no masses, no organomegaly Muscular skeletal: Limited exam - in bed, able to move all 4 extremities, Normal strength,  Neuro: Visual acuity intact, negative for double vision, CNII-XII intact. , normal motor and sensation, reflexes intact negative pronator drift, negative any speech changes Extremities: No pitting edema lower extremities, +2 pulses  Skin: Dry, warm to touch, negative for any Rashes, No open wounds Wounds: per nursing documentation       LABs:  CBC Latest Ref Rng & Units 05/22/2019 06/24/2012  WBC 4.0 - 10.5 K/uL 5.2 5.7  Hemoglobin 13.0 - 17.0 g/dL 14.6 11.9(L)  Hematocrit 39.0 - 52.0 % 44.2 35.5(L)  Platelets 150 - 400 K/uL 300 311   CMP Latest Ref Rng & Units 05/22/2019 06/24/2012  Glucose 70 - 99 mg/dL 111(H) 101(H)  BUN 8 - 23 mg/dL 8 13  Creatinine 0.61 - 1.24 mg/dL 0.68 0.74  Sodium 135 - 145 mmol/L 138 141  Potassium 3.5 - 5.1 mmol/L 3.9 3.3(L)  Chloride 98 - 111 mmol/L 101 106  CO2 22 - 32 mmol/L 28 29  Calcium 8.9 - 10.3 mg/dL 8.8(L) 8.7  Total Protein 6.5 - 8.1 g/dL 7.6 7.1  Total Bilirubin 0.3 - 1.2 mg/dL 1.6(H) 1.2(H)  Alkaline Phos 38 - 126 U/L 25(L) 30(L)  AST 15 - 41 U/L 21 29  ALT 0 - 44 U/L 17 21        SIGNED: Deatra James, MD, FACP, FHM. [facility],  Pager [phone] (please [email or web address] to page/text)  If 7PM-7AM, please contact night-coverage [email or web address] 05/23/2019, 1:18 PM

## 2019-05-23 NOTE — Evaluation (Signed)
Physical Therapy Evaluation Patient Details Name: Caleb Reynolds MRN: [id number] DOB: 05-Aug-1935 Today's Date: 05/23/2019   History of Present Illness  presented to ER secondary to dizziness, unsteadiness and double vision; admitted for CVA work up. MRI significant for L temporal lobe infarct.  Clinical Impression  Upon evaluation, patient alert and oriented; follows commands and demonstrates good effort with mobility tasks.  Reports dizziness and double vision much improved since admission ("about 99% better").  Bilat UE/LE strength and ROM grossly symmetrical and WFL; no focal weakness, sensory or coordination deficits appreciated.  Able to complete bed mobility with indep; sit/stand, basic transfers and gait (150') without assist device, cga/close sup.  Demonstrates reciprocal stepping with good step height/length, good trunk rotation and arm swing; good ability to modulate gait speed on command with no LOB on sudden gait cessation. Mild sway, deviation with head turns; cga for balance recovery. Mild higher-level balance deficits evident (as noted with SLS, tandem stance); patient with good awareness and use of compensatory strategies noted. Would benefit from skilled PT to address above deficits and promote optimal return to PLOF.; recommend transition to home with outpatient PT upon discharge from acute hospital stay.    Follow Up Recommendations Outpatient PT    Equipment Recommendations       Recommendations for Other Services       Precautions / Restrictions Precautions Precautions: Fall Restrictions Weight Bearing Restrictions: No      Mobility  Bed Mobility Overal bed mobility: Modified Independent                Transfers Overall transfer level: Modified independent Equipment used: None             General transfer comment: good LE strenght/power; mild dizziness with initial transition to upright, resolves with accommodation to  position  Ambulation/Gait Ambulation/Gait assistance: Min guard Gait Distance (Feet): 150 Feet Assistive device: None       General Gait Details: reciprocal stepping with good step height/length, good trunk rotation and arm swing; good ability to modulate gait speed on command with no LOB on sudden gait cessation. Mild sway, deviation with head turns; cga for balance recovery.  Stairs            Wheelchair Mobility    Modified Rankin (Stroke Patients Only)       Balance Overall balance assessment: Needs assistance Sitting-balance support: No upper extremity supported;Feet supported Sitting balance-Leahy Scale: Normal     Standing balance support: No upper extremity supported Standing balance-Leahy Scale: Fair   Single Leg Stance - Right Leg: 6 Single Leg Stance - Left Leg: 10 Tandem Stance - Right Leg: 2 Tandem Stance - Left Leg: 1                     Pertinent Vitals/Pain Pain Assessment: No/denies pain    Home Living Family/patient expects to be discharged to:: Private residence Living Arrangements: Alone Available Help at Discharge: Family;Available PRN/intermittently Type of Home: House Home Access: Stairs to enter   [employer] of Steps: 2 Home Layout: One level Home Equipment: None      Prior Function Level of Independence: Independent         Comments: Active, walking 1-2 miles/day (was previous a marathon runner)     Hand Dominance        Extremity/Trunk Assessment   Upper Extremity Assessment Upper Extremity Assessment: Overall WFL for tasks assessed(grossly 5/5 throughout; no focal weakness, coordination or sensory deficit)  Lower Extremity Assessment Lower Extremity Assessment: Overall WFL for tasks assessed(grossly 5/5 throughout; no focal weakness, coordination or sensory deficit)       Communication   Communication: No difficulties  Cognition Arousal/Alertness: Awake/alert Behavior During Therapy: WFL  for tasks assessed/performed Overall Cognitive Status: Within Functional Limits for tasks assessed                                        General Comments      Exercises     Assessment/Plan    PT Assessment Patient needs continued PT services  PT Problem List Decreased activity tolerance;Decreased balance;Decreased mobility;Decreased coordination       PT Treatment Interventions DME instruction;Gait training;Stair training;Functional mobility training;Therapeutic activities;Therapeutic exercise;Balance training;Neuromuscular re-education;Patient/family education    PT Goals (Current goals can be found in the Care Plan section)  Acute Rehab PT Goals Patient Stated Goal: to return home and go back to work next week PT Goal Formulation: With patient Time For Goal Achievement: 06/06/19 Potential to Achieve Goals: Good    Frequency 7X/week   Barriers to discharge        Co-evaluation               AM-PAC PT "6 Clicks" Mobility  Outcome Measure Help needed turning from your back to your side while in a flat bed without using bedrails?: None Help needed moving from lying on your back to sitting on the side of a flat bed without using bedrails?: None Help needed moving to and from a bed to a chair (including a wheelchair)?: None Help needed standing up from a chair using your arms (e.g., wheelchair or bedside chair)?: None Help needed to walk in hospital room?: A Little Help needed climbing 3-5 steps with a railing? : A Little 6 Click Score: 22    End of Session Equipment Utilized During Treatment: Gait belt Activity Tolerance: Patient tolerated treatment well Patient left: in bed;with call bell/phone within reach Nurse Communication: Mobility status PT Visit Diagnosis: Difficulty in walking, not elsewhere classified (R26.2);Muscle weakness (generalized) (M62.81);Other symptoms and signs involving the nervous system (R29.898) Hemiplegia - caused by:  Cerebral infarction    Time: [id number] PT Time Calculation (min) (ACUTE ONLY): 20 min   Charges:   PT Evaluation $PT Eval Moderate Complexity: 1 Mod PT Treatments $Therapeutic Activity: 8-22 mins        Elycia Woodside H. Owens Shark, PT, DPT, NCS 05/23/19, 11:23 AM [phone]

## 2019-05-23 NOTE — ED Notes (Signed)
Secure chat message sent to hospitalist re: ordering patient's home medications.

## 2019-05-23 NOTE — Progress Notes (Signed)
SLP Cancellation Note  Patient Details Name: Caleb Reynolds MRN: [id number] DOB: [birth year]   Cancelled treatment:       Reason Eval/Treat Not Completed: SLP screened, no needs identified, will sign off(chart reviewed; consulted NSG then met w/ pt). Pt denied any difficulty swallowing and is currently on a regular diet; tolerates swallowing pills w/ water per NSG. Pt conversed at conversational level w/out deficits noted; pt and NSG denied any speech-language deficits. Pt described his [place] work in detail to this SLP.  No further skilled ST services indicated as pt appears at his baseline. Pt agreed. NSG to reconsult if any change in status.     Orinda Kenner, MS, CCC-SLP [place] [place] 05/23/2019, 2:57 PM

## 2019-05-23 NOTE — ED Notes (Signed)
To patient's room, pt had call light on, looking for breakfast, breakfast sitting at bedside, pt states he is not sure when it came in,   Informed patient that I ordered a new tray anyways since I had thought he didn't get a tray.  Pt also upset about visitation policy and asking for exception to made for his daughter to visit. Informed him that unfortunately we cannot make any exceptions at this time, but when he is admitted that he can have 2 visitors.  Pt asking about when he would be moved to a different room, advised patient that at this time I do not know but hopefully maybe this afternoon if there are some discharges in the hospital.  Pt made comment in regards that him waiting is unacceptable and makes him think about wanting to leave.  Advised patient that he should stay to be evaluated.   Pt's daughter had also called with concerns. Megan RN who is taking over the assignment will call and talk with her and help address her concerns.

## 2019-05-23 NOTE — Evaluation (Signed)
Occupational Therapy Evaluation Patient Details Name: Caleb Reynolds MRN: [id number] DOB: 07/07/1935 Today's Date: 05/23/2019    History of Present Illness presented to ER secondary to dizziness, unsteadiness and double vision; admitted for CVA work up. MRI significant for L temporal lobe infarct.   Clinical Impression   Caleb Reynolds was seen for OT evaluation this date. Prior to hospital admission, pt was active and independent in all aspects of ADL/IADL. 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 He also reports staying as active as possible. He generally walks 1-2 miles per day several times a week. Pt lives alone in a 1-level home with 2 steps to enter. Currently pt reporting symptoms have generally resolved. He does endorse "a little fuzziness" to the left of letters/numbers, but this disappears with his left eye covered. Pt demonstrates baseline independence to perform ADL and mobility tasks and no strength, sensory, coordination, or cognitive deficits appreciated with assessment. No skilled OT needs identified. Will sign off. Please re-consult if additional OT needs arise.     Follow Up Recommendations  No OT follow up    Equipment Recommendations  None recommended by OT    Recommendations for Other Services       Precautions / Restrictions Precautions Precautions: Fall Restrictions Weight Bearing Restrictions: No      Mobility Bed Mobility Overal bed mobility: Modified Independent                Transfers Overall transfer level: Modified independent Equipment used: None             General transfer comment: good LE strenght/power; mild dizziness with initial transition to upright, resolves with accommodation to position    Balance Overall balance assessment: No apparent balance deficits (not formally assessed) Sitting-balance support: No upper extremity supported;Feet supported Sitting balance-Leahy Scale: Normal      Standing balance support: No upper extremity supported Standing balance-Leahy Scale: Fair   Single Leg Stance - Right Leg: 6 Single Leg Stance - Left Leg: 10 Tandem Stance - Right Leg: 2 Tandem Stance - Left Leg: 1                   ADL either performed or assessed with clinical judgement   ADL Overall ADL's : At baseline                                       General ADL Comments: Pt presents at baseline level of functional independence to perform ADL management. No cognitive, strength, or sensory deficits appreciated with assessment this date.     Vision Baseline Vision/History: Wears glasses Wears Glasses: Reading only Patient Visual Report: Diplopia Additional Comments: Pt reports diploplia is significantly improved from time of initial presentation. He reports "a little fuzziness" to the left of letters/pictures in room, but states he is no longer seeing double. Symptoms improve with left eye covered. Overall WFLs.     Perception     Praxis      Pertinent Vitals/Pain Pain Assessment: No/denies pain     Hand Dominance Right   Extremity/Trunk Assessment Upper Extremity Assessment Upper Extremity Assessment: Overall WFL for tasks assessed(5/5 throughout BUE. No sensory, [place], or [place] deficits appreciated with assessment.)   Lower Extremity Assessment Lower Extremity Assessment: Defer to PT evaluation;Overall [facility] for tasks assessed   Cervical / Trunk Assessment Cervical / Trunk Assessment: Normal  Communication Communication Communication: No difficulties   Cognition Arousal/Alertness: Awake/alert Behavior During Therapy: WFL for tasks assessed/performed Overall Cognitive Status: Within Functional Limits for tasks assessed                                     General Comments       Exercises Other Exercises Other Exercises: Pt education limited. MD in room at end of session to discuss POC with pt.   Shoulder Instructions       Home Living Family/patient expects to be discharged to:: Private residence Living Arrangements: Alone Available Help at Discharge: Family;Available PRN/intermittently Type of Home: House Home Access: Stairs to enter [employer] of Steps: 2   Home Layout: One level     Bathroom Shower/Tub: Occupational psychologist: Standard     Home Equipment: None          Prior Functioning/Environment Level of Independence: Independent        Comments: Active, walking 1-2 miles/day (was previous a marathon runner), +driving, working in TEFL teacher        OT Problem List: Impaired vision/perception      OT Treatment/Interventions:      OT Goals(Current goals can be found in the care plan section) Acute Rehab OT Goals Patient Stated Goal: to return home and go back to work next week OT Goal Formulation: All assessment and education complete, DC therapy Time For Goal Achievement: 05/23/19  OT Frequency:     Barriers to D/C:            Co-evaluation              AM-PAC OT "6 Clicks" Daily Activity     Outcome Measure Help from another person eating meals?: None Help from another person taking care of personal grooming?: None Help from another person toileting, which includes using toliet, bedpan, or urinal?: None Help from another person bathing (including washing, rinsing, drying)?: None Help from another person to put on and taking off regular upper body clothing?: None Help from another person to put on and taking off regular lower body clothing?: None 6 Click Score: 24   End of Session    Activity Tolerance: Patient tolerated treatment well Patient left: in bed;with call bell/phone within reach;Other (comment)(In stretcher bed, with MD in room)  OT Visit Diagnosis: Other abnormalities of gait and mobility (R26.89)                Time: [id number] OT Time Calculation (min): 14 min Charges:  OT General Charges $OT  Visit: 1 Visit OT Evaluation $OT Eval Low Complexity: 1 Low  Shara Blazing, M.S., OTR/L Ascom: [phone] 05/23/19, 2:48 PM

## 2019-05-23 NOTE — ED Notes (Signed)
Pt reports double vision has improved, denies any new complaints. Pt's son at bedside.  Pt placed on cardiac monitor at this time.   Advised son of visitation policy.

## 2019-05-23 NOTE — ED Notes (Signed)
Pt updated, son remains at bedside. Pt denies any needs, lights turned off to encourage sleep

## 2019-05-24 DIAGNOSIS — I10 Essential (primary) hypertension: Secondary | ICD-10-CM

## 2019-05-24 LAB — HEMOGLOBIN A1C
Hgb A1c MFr Bld: 5.8 % — ABNORMAL HIGH (ref 4.8–5.6)
Mean Plasma Glucose: 120 mg/dL

## 2019-05-24 MED ORDER — CLOPIDOGREL BISULFATE 75 MG PO TABS: 75.0000 mg | ORAL_TABLET | Freq: Every day | ORAL | 0 refills | Status: DC

## 2019-05-24 MED ORDER — ATORVASTATIN CALCIUM 80 MG PO TABS: 80.0000 mg | ORAL_TABLET | Freq: Every day | ORAL | 0 refills | Status: DC

## 2019-05-24 MED ORDER — ASPIRIN 81 MG PO TBEC: 81.0000 mg | DELAYED_RELEASE_TABLET | Freq: Every day | ORAL | 0 refills | Status: AC

## 2019-05-24 NOTE — Discharge Summary (Signed)
[place] [facility] [place] NAME: Caleb Reynolds    MR#:  [id number]  DATE OF BIRTH:  1935-09-29  DATE OF ADMISSION:  05/22/2019   ADMITTING PHYSICIAN: Deatra James, MD  DATE OF DISCHARGE: 05/24/2019 12:29 PM  PRIMARY CARE PHYSICIAN: Albina Billet, MD   ADMISSION DIAGNOSIS:  CVA (cerebral vascular accident) [facility]) [I63.9] Acute CVA (cerebrovascular accident) [facility]) [I63.9] Cerebrovascular accident (CVA), unspecified mechanism ([place]) [I63.9] DISCHARGE DIAGNOSIS:  Principal Problem:   Acute CVA (cerebrovascular accident) [facility]) Active Problems:   Essential hypertension   CVA (cerebral vascular accident) ([place])  SECONDARY DIAGNOSIS:   Past Medical History:  Diagnosis Date  . Chronic kidney disease    kidney stones  . Hypertension    HOSPITAL COURSE:  84 y.o.malewith medical history significant forhypertension, admitted for dizziness, unsteady gait and double vision.  Acute CVA (cerebrovascular accident) [facility]) -Patient outside time window for tPA. Symptoms improved with only some residual visual changes remaining.  -MRI of the brain showed an acute left temporal lobe periventricular infarct. Small vessel disease progressed since 2014. Likely secondary to small vessel disease based on MRA findings. MRA shows multifocal stenoses with severe stenoses noted at the proximal right M2, mid left P2 and moderate stenosis at the right P2.  -Echocardiogram shows no cardiac source of emboli with an EF of55-60%. A1c 5.8, LDL164. Patient on no antiplatelet or statin therapy prior to admission. -Neurology seen and recommends prophylactic therapy-Dual antiplatelet therapy with ASA 81mg  and Plavix 75mg  for three weeks with change toASA 81mg dailyalone as monotherapy after that time. - Follow up with ophthalmology on an outpatient basis -Outpatient urology follow-up   DISCHARGE CONDITIONS:  Stable CONSULTS OBTAINED:  Treatment Team:  Alexis Goodell, MD DRUG ALLERGIES:  No Known Allergies DISCHARGE MEDICATIONS:   Allergies as of 05/24/2019   No Known Allergies     Medication List    TAKE these medications   acetaminophen 650 MG CR tablet Commonly known as: TYLENOL Take 650 mg by mouth daily.   amLODipine 10 MG tablet Commonly known as: NORVASC Take 10 mg by mouth daily.   aspirin 81 MG EC tablet Take 1 tablet (81 mg total) by mouth daily. Start taking on: May 25, 2019   atorvastatin 80 MG tablet Commonly known as: LIPITOR Take 1 tablet (80 mg total) by mouth daily. Start taking on: May 25, 2019   clopidogrel 75 MG tablet Commonly known as: PLAVIX Take 1 tablet (75 mg total) by mouth daily. Start taking on: May 25, 2019   loratadine 10 MG tablet Commonly known as: CLARITIN Take 10 mg by mouth daily.   pantoprazole 40 MG tablet Commonly known as: PROTONIX Take 40 mg by mouth 2 (two) times daily before a meal.   sucralfate 1 g tablet Commonly known as: CARAFATE Take 1 g by mouth 4 (four) times daily.      DISCHARGE INSTRUCTIONS:   DIET:  Low fat, Low cholesterol diet DISCHARGE CONDITION:  Stable ACTIVITY:  Activity as tolerated OXYGEN:  Home Oxygen: No.  Oxygen Delivery: room air DISCHARGE LOCATION:  home   If you experience worsening of your admission symptoms, develop shortness of breath, life threatening emergency, suicidal or homicidal thoughts you must seek medical attention immediately by calling 911 or calling your MD immediately  if symptoms less severe.  You Must read complete instructions/literature along with all the possible adverse reactions/side effects for all the Medicines you take and that have  been prescribed to you. Take any new Medicines after you have completely understood and accpet all the possible adverse reactions/side effects.   Please note  You were cared for by a hospitalist during your hospital stay. If you have any questions about your discharge  medications or the care you received while you were in the hospital after you are discharged, you can call the unit and asked to speak with the hospitalist on call if the hospitalist that took care of you is not available. Once you are discharged, your primary care physician will handle any further medical issues. Please note that NO REFILLS for any discharge medications will be authorized once you are discharged, as it is imperative that you return to your primary care physician (or establish a relationship with a primary care physician if you do not have one) for your aftercare needs so that they can reassess your need for medications and monitor your lab values.    On the day of Discharge:  VITAL SIGNS:  Blood pressure 131/68, pulse 65, temperature 97.7 F (36.5 C), temperature source Oral, resp. rate 13, height 5\' 11"  (1.803 m), weight 68 kg, SpO2 97 %. PHYSICAL EXAMINATION:  GENERAL:  84 y.o.-year-old patient lying in the bed with no acute distress.  EYES: Pupils equal, round, reactive to light and accommodation. No scleral icterus. Extraocular muscles intact.  HEENT: Head atraumatic, normocephalic. Oropharynx and nasopharynx clear.  NECK:  Supple, no jugular venous distention. No thyroid enlargement, no tenderness.  LUNGS: Normal breath sounds bilaterally, no wheezing, rales,rhonchi or crepitation. No use of accessory muscles of respiration.  CARDIOVASCULAR: S1, S2 normal. No murmurs, rubs, or gallops.  ABDOMEN: Soft, non-tender, non-distended. Bowel sounds present. No organomegaly or mass.  EXTREMITIES: No pedal edema, cyanosis, or clubbing.  NEUROLOGIC: Cranial nerves II through XII are intact. Muscle strength 5/5 in all extremities. Sensation intact. Gait not checked.  PSYCHIATRIC: The patient is alert and oriented x 3.  SKIN: No obvious rash, lesion, or ulcer.  DATA REVIEW:   CBC Recent Labs  Lab 05/22/19 1221  WBC 5.2  HGB 14.6  HCT 44.2  PLT 300    Chemistries  Recent  Labs  Lab 05/22/19 1221  NA 138  K 3.9  CL 101  CO2 28  GLUCOSE 111*  BUN 8  CREATININE 0.68  CALCIUM 8.8*  AST 21  ALT 17  ALKPHOS 25*  BILITOT 1.6*     Outpatient follow-up Follow-up Information    Albina Billet, MD On 06/06/2019.   Specialty: Internal Medicine Why: at 10:30 a.m. Contact information: [address]   [place] [address] [phone]        Vladimir Crofts, MD. Go on 06/08/2019.   Specialty: Neurology Why: at 9:30 a.m. Contact information: [facility] [place] [address] [phone]        Birder Robson, MD. Go in 1 week.   Specialty: Ophthalmology Contact information: [place] [address] [phone]            Management plans discussed with the patient, family and they are in agreement.  CODE STATUS: Full Code   TOTAL TIME TAKING CARE OF THIS PATIENT: 45 minutes.    Max Sane M.D on 05/24/2019 at 4:14 PM  [facility]   CC: Primary care physician; Albina Billet, MD   Note: This dictation was prepared with Dragon dictation along with smaller phrase technology. Any transcriptional errors that result from this process  are unintentional.

## 2019-05-24 NOTE — TOC Transition Note (Signed)
Transition of Care [facility]) - CM/SW Discharge Note   Patient Details  Name: Caleb Reynolds MRN: [id number] Date of Birth: 02-23-1935  Transition of [facility]) CM/SW Contact:  Shelbie Hutching, RN Phone Number: 05/24/2019, 12:09 PM   Clinical Narrative:    Patient admitted to the hospital with CVA.  Patient is doing well and has been medically cleared for discharge.  Patient will discharge home, where he lives alone.  Patient agreed to OP Physical Therapy here at [facility] - referral has been faxed.  Patient's son [name] lives down the road and will be able to check in on patient and he will provide transportation home today.   No other discharge needs identified at this time.    Final next level of care: OP Rehab Barriers to Discharge: Barriers Resolved   Patient Goals and CMS Choice   CMS Medicare.gov Compare Post Acute Care list provided to:: Patient Choice offered to / list presented to : Patient  Discharge Placement                       Discharge Plan and Services   Discharge Planning Services: CM Consult Post Acute Care Choice: (outpatient PT [facility])                               Social Determinants of Health (SDOH) Interventions     Readmission Risk Interventions No flowsheet data found.

## 2019-05-24 NOTE — Progress Notes (Signed)
Subjective: No new neurological complaints.  Reports continued improvement in vision.    Objective: Current vital signs: BP 131/68 (BP Location: Right Arm)   Pulse 65   Temp 97.7 F (36.5 C) (Oral)   Resp 13   Ht 5\' 11"  (1.803 m)   Wt 68 kg   SpO2 97%   BMI 20.92 kg/m  Vital signs in last 24 hours: Temp:  [97.7 F (36.5 C)-98.3 F (36.8 C)] 97.7 F (36.5 C) (04/14 0731) Pulse Rate:  [65-94] 65 (04/14 0731) Resp:  [13-22] 13 (04/14 0731) BP: (127-159)/(68-98) 131/68 (04/14 0731) SpO2:  [88 %-97 %] 97 % (04/14 0731)  Intake/Output from previous day: No intake/output data recorded. Intake/Output this shift: Total I/O In: 360 [P.O.:360] Out: -  Nutritional status:  Diet Order            Diet Heart Room service appropriate? Yes; Fluid consistency: Thin  Diet effective now              Neurologic Exam: Mental Status: Alert, oriented, thought content appropriate.  Speech fluent without evidence of aphasia.  Able to follow 3 step commands without difficulty. Cranial Nerves: II: Blurred vision from the left eye.  Intact vision on the right.  Pupils equal, round, reactive to light and accommodation III,IV, VI: ptosis not present, extra-ocular motions intact bilaterally V,VII: smile symmetric, facial light touch sensation normal bilaterally VIII: hearing normal bilaterally IX,X: gag reflex present XI: bilateral shoulder shrug XII: midline tongue extension Motor: 5/5 throughout Sensory: Pinprick and light touch intact throughout, bilaterally Cerebellar: Finger-to-nose and heel-to-shin testing intact bilaterally   Lab Results: Basic Metabolic Panel: Recent Labs  Lab 05/22/19 1221  NA 138  K 3.9  CL 101  CO2 28  GLUCOSE 111*  BUN 8  CREATININE 0.68  CALCIUM 8.8*    Liver Function Tests: Recent Labs  Lab 05/22/19 1221  AST 21  ALT 17  ALKPHOS 25*  BILITOT 1.6*  PROT 7.6  ALBUMIN 4.5   No results for input(s): LIPASE, AMYLASE in the last 168  hours. No results for input(s): AMMONIA in the last 168 hours.  CBC: Recent Labs  Lab 05/22/19 1221  WBC 5.2  NEUTROABS 3.7  HGB 14.6  HCT 44.2  MCV 91.5  PLT 300    Cardiac Enzymes: No results for input(s): CKTOTAL, CKMB, CKMBINDEX, TROPONINI in the last 168 hours.  Lipid Panel: Recent Labs  Lab 05/23/19 0555  CHOL 244*  TRIG 186*  HDL 43  CHOLHDL 5.7  VLDL 37  LDLCALC 164*    CBG: No results for input(s): GLUCAP in the last 168 hours.  Microbiology: Results for orders placed or performed during the hospital encounter of 05/22/19  SARS CORONAVIRUS 2 (TAT 6-24 HRS) Nasopharyngeal Nasopharyngeal Swab     Status: None   Collection Time: 05/22/19 11:01 PM   Specimen: Nasopharyngeal Swab  Result Value Ref Range Status   SARS Coronavirus 2 NEGATIVE NEGATIVE Final    Comment: (NOTE) SARS-CoV-2 target nucleic acids are NOT DETECTED. The SARS-CoV-2 RNA is generally detectable in upper and lower respiratory specimens during the acute phase of infection. Negative results do not preclude SARS-CoV-2 infection, do not rule out co-infections with other pathogens, and should not be used as the sole basis for treatment or other patient management decisions. Negative results must be combined with clinical observations, patient history, and epidemiological information. The expected result is Negative. Fact Sheet for Patients: SugarRoll.be Fact Sheet for Healthcare Providers: https://www.woods-mathews.com/ This test is not yet  approved or cleared by the [place] and  has been authorized for detection and/or diagnosis of SARS-CoV-2 by FDA under an Emergency Use Authorization (EUA). This EUA will remain  in effect (meaning this test can be used) for the duration of the COVID-19 declaration under Section 56 4(b)(1) of the Act, 21 U.S.C. section 360bbb-3(b)(1), unless the authorization is terminated or revoked sooner. Performed at  [facility], [place] [address] [place], [place] [address]     Coagulation Studies: Recent Labs    05/22/19 1221  LABPROT 13.2  INR 1.0    Imaging: CT HEAD WO CONTRAST  Result Date: 05/22/2019 CLINICAL DATA:  Acute neuro deficit. Double vision and dizziness. Rule out stroke EXAM: CT HEAD WITHOUT CONTRAST TECHNIQUE: Contiguous axial images were obtained from the base of the skull through the vertex without intravenous contrast. COMPARISON:  CT head 06/24/2012 FINDINGS: Brain: No evidence of acute infarction, hemorrhage, hydrocephalus, extra-axial collection or mass lesion/mass effect. Mild chronic microvascular ischemic change in the white matter. Cerebral volume normal for age. Vascular: Atherosclerotic calcification. Negative for hyperdense vessel Skull: Negative Sinuses/Orbits: Negative Other: None IMPRESSION: No acute abnormality. Electronically Signed   By: Franchot Gallo M.D.   On: 05/22/2019 12:40   MR ANGIO HEAD WO CONTRAST  Result Date: 05/22/2019 CLINICAL DATA:  Focal neuro deficit, greater than 6 hours, stroke suspected. Additional history provided: Patient reports dizziness, double vision, difficulty walking due to difficulty keeping balance, onset last night, symptoms continue. EXAM: MR HEAD WITHOUT CONTRAST MR CIRCLE OF WILLIS WITHOUT CONTRAST MRA OF THE NECK WITHOUT AND WITH CONTRAST TECHNIQUE: Multiplanar, multiecho pulse sequences of the brain, circle of willis and surrounding structures were obtained without intravenous contrast. Angiographic images of the neck were obtained using MRA technique without and with intravenous contrast. CONTRAST:  69mL GADAVIST GADOBUTROL 1 MMOL/ML IV SOLN COMPARISON:  Head CT 05/22/2019, brain MRI 06/24/2012 FINDINGS: MR HEAD FINDINGS Brain: There is a 2 cm focus of restricted diffusion within the periventricular left temporal lobe, along the atrium and temporal horn of the left lateral ventricle. Findings are consistent with acute infarction.  Corresponding T2/FLAIR hyperintensity at this site. There is no evidence of acute infarct elsewhere within the brain. No evidence of intracranial mass. No midline shift or extra-axial fluid collection. No chronic intracranial blood products. Mild for age scattered T2/FLAIR hyperintensity within the cerebral white matter has progressed from MRI 06/24/2012. Findings are nonspecific, but consistent with chronic small vessel ischemic disease. Sever tiny chronic lacunar infarcts within the left cerebellar hemisphere were not present on this prior MRI. Stable, mild generalized parenchymal atrophy. Vascular: Reported below. Skull and upper cervical spine: No focal marrow lesion. Sinuses/Orbits: Visualized orbits demonstrate no acute abnormality. Mild ethmoid sinus mucosal thickening. Trace fluid within bilateral mastoid air cells. MR CIRCLE OF WILLIS FINDINGS The intracranial internal carotid arteries are patent without significant stenosis. The M1 middle cerebral arteries are patent without significant stenosis. Apparent high-grade focal stenosis within a proximal M2 right MCA branch vessel (series 1045, image 13). No other high-grade proximal M2 stenosis is identified. The anterior cerebral arteries are patent without high-grade proximal stenosis. No intracranial aneurysm is identified. The non dominant intracranial right vertebral artery is patent and appears to terminate predominantly as the right PICA. The dominant intracranial left vertebral artery is patent without significant stenosis, as is the basilar artery. Predominantly fetal origin of the right posterior cerebral artery. The posterior cerebral arteries are patent bilaterally. Sites of up to moderate stenosis within the proximal  P2 right PCA. Moderate to moderately severe focal stenosis within the mid P2 left PCA (series 1031, image 5). Atherosclerotic irregularity of distal left PCA branches is also noted. A small left posterior communicating artery is  present. MRA NECK FINDINGS Standard aortic branching. No hemodynamically significant stenosis of the innominate or proximal subclavian arteries. The bilateral common and internal carotid arteries are patent without significant stenosis (50% or greater). The vertebral arteries are patent within the neck bilaterally with antegrade flow. There is limited assessment for stenoses within the non dominant cervical right vertebral artery due to developmental small vessel size. There are multifocal sites of apparent moderate narrowing within the V1 and V2 segments of the dominant cervical left vertebral artery on the postcontrast imaging. However, these stenoses may be accentuated by motion artifact. IMPRESSION: MRI brain: 1. 2 cm acute infarct within the periventricular left temporal lobe. 2. Mild chronic small vessel ischemic changes within the cerebral white matter, progressed as compared MRI 06/24/2012. Several tiny chronic lacunar infarcts within the left cerebellar hemisphere were not present on this prior exam. 3. Stable, mild generalized parenchymal atrophy. 4. Mild ethmoid sinus mucosal thickening. Trace fluid within bilateral mastoid air cells. MRA head: 1. Intracranial atherosclerotic disease with multifocal stenoses, most notably as follows. 2. Apparent severe focal stenosis within a proximal M2 right MCA branch vessel. 3. Moderate to moderately severe focal stenosis within the mid P2 left posterior cerebral artery. 4. Sites of up to moderate stenosis within the proximal P2 right PCA. MRA neck: 1. The bilateral common and internal carotid arteries are patent within the neck without significant stenosis. 2. The vertebral arteries are patent within the neck bilaterally with antegrade flow. 3. There is limited assessment for stenoses within the non dominant cervical right vertebral artery due to developmental small vessel size. 4. There are apparent moderate stenoses within the V1 and V2 segments of the dominant  cervical left vertebral artery on the post-contrast imaging. However, the stenoses may be accentuated by motion artifact. Electronically Signed   By: Kellie Simmering DO   On: 05/22/2019 22:31   MR Angiogram Neck W or Wo Contrast  Result Date: 05/22/2019 CLINICAL DATA:  Focal neuro deficit, greater than 6 hours, stroke suspected. Additional history provided: Patient reports dizziness, double vision, difficulty walking due to difficulty keeping balance, onset last night, symptoms continue. EXAM: MR HEAD WITHOUT CONTRAST MR CIRCLE OF WILLIS WITHOUT CONTRAST MRA OF THE NECK WITHOUT AND WITH CONTRAST TECHNIQUE: Multiplanar, multiecho pulse sequences of the brain, circle of willis and surrounding structures were obtained without intravenous contrast. Angiographic images of the neck were obtained using MRA technique without and with intravenous contrast. CONTRAST:  64mL GADAVIST GADOBUTROL 1 MMOL/ML IV SOLN COMPARISON:  Head CT 05/22/2019, brain MRI 06/24/2012 FINDINGS: MR HEAD FINDINGS Brain: There is a 2 cm focus of restricted diffusion within the periventricular left temporal lobe, along the atrium and temporal horn of the left lateral ventricle. Findings are consistent with acute infarction. Corresponding T2/FLAIR hyperintensity at this site. There is no evidence of acute infarct elsewhere within the brain. No evidence of intracranial mass. No midline shift or extra-axial fluid collection. No chronic intracranial blood products. Mild for age scattered T2/FLAIR hyperintensity within the cerebral white matter has progressed from MRI 06/24/2012. Findings are nonspecific, but consistent with chronic small vessel ischemic disease. Sever tiny chronic lacunar infarcts within the left cerebellar hemisphere were not present on this prior MRI. Stable, mild generalized parenchymal atrophy. Vascular: Reported below. Skull and upper cervical spine: No  focal marrow lesion. Sinuses/Orbits: Visualized orbits demonstrate no acute  abnormality. Mild ethmoid sinus mucosal thickening. Trace fluid within bilateral mastoid air cells. MR CIRCLE OF WILLIS FINDINGS The intracranial internal carotid arteries are patent without significant stenosis. The M1 middle cerebral arteries are patent without significant stenosis. Apparent high-grade focal stenosis within a proximal M2 right MCA branch vessel (series 1045, image 13). No other high-grade proximal M2 stenosis is identified. The anterior cerebral arteries are patent without high-grade proximal stenosis. No intracranial aneurysm is identified. The non dominant intracranial right vertebral artery is patent and appears to terminate predominantly as the right PICA. The dominant intracranial left vertebral artery is patent without significant stenosis, as is the basilar artery. Predominantly fetal origin of the right posterior cerebral artery. The posterior cerebral arteries are patent bilaterally. Sites of up to moderate stenosis within the proximal P2 right PCA. Moderate to moderately severe focal stenosis within the mid P2 left PCA (series 1031, image 5). Atherosclerotic irregularity of distal left PCA branches is also noted. A small left posterior communicating artery is present. MRA NECK FINDINGS Standard aortic branching. No hemodynamically significant stenosis of the innominate or proximal subclavian arteries. The bilateral common and internal carotid arteries are patent without significant stenosis (50% or greater). The vertebral arteries are patent within the neck bilaterally with antegrade flow. There is limited assessment for stenoses within the non dominant cervical right vertebral artery due to developmental small vessel size. There are multifocal sites of apparent moderate narrowing within the V1 and V2 segments of the dominant cervical left vertebral artery on the postcontrast imaging. However, these stenoses may be accentuated by motion artifact. IMPRESSION: MRI brain: 1. 2 cm acute  infarct within the periventricular left temporal lobe. 2. Mild chronic small vessel ischemic changes within the cerebral white matter, progressed as compared MRI 06/24/2012. Several tiny chronic lacunar infarcts within the left cerebellar hemisphere were not present on this prior exam. 3. Stable, mild generalized parenchymal atrophy. 4. Mild ethmoid sinus mucosal thickening. Trace fluid within bilateral mastoid air cells. MRA head: 1. Intracranial atherosclerotic disease with multifocal stenoses, most notably as follows. 2. Apparent severe focal stenosis within a proximal M2 right MCA branch vessel. 3. Moderate to moderately severe focal stenosis within the mid P2 left posterior cerebral artery. 4. Sites of up to moderate stenosis within the proximal P2 right PCA. MRA neck: 1. The bilateral common and internal carotid arteries are patent within the neck without significant stenosis. 2. The vertebral arteries are patent within the neck bilaterally with antegrade flow. 3. There is limited assessment for stenoses within the non dominant cervical right vertebral artery due to developmental small vessel size. 4. There are apparent moderate stenoses within the V1 and V2 segments of the dominant cervical left vertebral artery on the post-contrast imaging. However, the stenoses may be accentuated by motion artifact. Electronically Signed   By: Kellie Simmering DO   On: 05/22/2019 22:31   MR BRAIN WO CONTRAST  Result Date: 05/22/2019 CLINICAL DATA:  Focal neuro deficit, greater than 6 hours, stroke suspected. Additional history provided: Patient reports dizziness, double vision, difficulty walking due to difficulty keeping balance, onset last night, symptoms continue. EXAM: MR HEAD WITHOUT CONTRAST MR CIRCLE OF WILLIS WITHOUT CONTRAST MRA OF THE NECK WITHOUT AND WITH CONTRAST TECHNIQUE: Multiplanar, multiecho pulse sequences of the brain, circle of willis and surrounding structures were obtained without intravenous contrast.  Angiographic images of the neck were obtained using MRA technique without and with intravenous contrast. CONTRAST:  64mL  GADAVIST GADOBUTROL 1 MMOL/ML IV SOLN COMPARISON:  Head CT 05/22/2019, brain MRI 06/24/2012 FINDINGS: MR HEAD FINDINGS Brain: There is a 2 cm focus of restricted diffusion within the periventricular left temporal lobe, along the atrium and temporal horn of the left lateral ventricle. Findings are consistent with acute infarction. Corresponding T2/FLAIR hyperintensity at this site. There is no evidence of acute infarct elsewhere within the brain. No evidence of intracranial mass. No midline shift or extra-axial fluid collection. No chronic intracranial blood products. Mild for age scattered T2/FLAIR hyperintensity within the cerebral white matter has progressed from MRI 06/24/2012. Findings are nonspecific, but consistent with chronic small vessel ischemic disease. Sever tiny chronic lacunar infarcts within the left cerebellar hemisphere were not present on this prior MRI. Stable, mild generalized parenchymal atrophy. Vascular: Reported below. Skull and upper cervical spine: No focal marrow lesion. Sinuses/Orbits: Visualized orbits demonstrate no acute abnormality. Mild ethmoid sinus mucosal thickening. Trace fluid within bilateral mastoid air cells. MR CIRCLE OF WILLIS FINDINGS The intracranial internal carotid arteries are patent without significant stenosis. The M1 middle cerebral arteries are patent without significant stenosis. Apparent high-grade focal stenosis within a proximal M2 right MCA branch vessel (series 1045, image 13). No other high-grade proximal M2 stenosis is identified. The anterior cerebral arteries are patent without high-grade proximal stenosis. No intracranial aneurysm is identified. The non dominant intracranial right vertebral artery is patent and appears to terminate predominantly as the right PICA. The dominant intracranial left vertebral artery is patent without  significant stenosis, as is the basilar artery. Predominantly fetal origin of the right posterior cerebral artery. The posterior cerebral arteries are patent bilaterally. Sites of up to moderate stenosis within the proximal P2 right PCA. Moderate to moderately severe focal stenosis within the mid P2 left PCA (series 1031, image 5). Atherosclerotic irregularity of distal left PCA branches is also noted. A small left posterior communicating artery is present. MRA NECK FINDINGS Standard aortic branching. No hemodynamically significant stenosis of the innominate or proximal subclavian arteries. The bilateral common and internal carotid arteries are patent without significant stenosis (50% or greater). The vertebral arteries are patent within the neck bilaterally with antegrade flow. There is limited assessment for stenoses within the non dominant cervical right vertebral artery due to developmental small vessel size. There are multifocal sites of apparent moderate narrowing within the V1 and V2 segments of the dominant cervical left vertebral artery on the postcontrast imaging. However, these stenoses may be accentuated by motion artifact. IMPRESSION: MRI brain: 1. 2 cm acute infarct within the periventricular left temporal lobe. 2. Mild chronic small vessel ischemic changes within the cerebral white matter, progressed as compared MRI 06/24/2012. Several tiny chronic lacunar infarcts within the left cerebellar hemisphere were not present on this prior exam. 3. Stable, mild generalized parenchymal atrophy. 4. Mild ethmoid sinus mucosal thickening. Trace fluid within bilateral mastoid air cells. MRA head: 1. Intracranial atherosclerotic disease with multifocal stenoses, most notably as follows. 2. Apparent severe focal stenosis within a proximal M2 right MCA branch vessel. 3. Moderate to moderately severe focal stenosis within the mid P2 left posterior cerebral artery. 4. Sites of up to moderate stenosis within the  proximal P2 right PCA. MRA neck: 1. The bilateral common and internal carotid arteries are patent within the neck without significant stenosis. 2. The vertebral arteries are patent within the neck bilaterally with antegrade flow. 3. There is limited assessment for stenoses within the non dominant cervical right vertebral artery due to developmental small vessel size. 4. There  are apparent moderate stenoses within the V1 and V2 segments of the dominant cervical left vertebral artery on the post-contrast imaging. However, the stenoses may be accentuated by motion artifact. Electronically Signed   By: Kellie Simmering DO   On: 05/22/2019 22:31   ECHOCARDIOGRAM COMPLETE  Result Date: 05/23/2019    ECHOCARDIOGRAM REPORT   Patient Name:   Caleb Reynolds Date of Exam: 05/23/2019 Medical Rec #:  [id number]         Height:       71.0 in Accession #:    [id number]        Weight:       150.0 lb Date of Birth:  September 20, 1935         BSA:          1.866 m Patient Age:    22 years          BP:           153/77 mmHg Patient Gender: M                 HR:           76 bpm. Exam Location:  [facility] Procedure: Color Doppler and Cardiac Doppler Indications:     I163.9 Stroke  History:         Patient has no prior history of Echocardiogram examinations.                  Risk Factors:Former Smoker and Hypertension.  Sonographer:     Charmayne Sheer RDCS (AE) Referring Phys:  [id number] Athena Masse Diagnosing Phys: Nelva Bush MD IMPRESSIONS  1. Left ventricular ejection fraction, by estimation, is 55 to 60%. The left ventricle has normal function. The left ventricle has no regional wall motion abnormalities. Left ventricular diastolic parameters are consistent with Grade I diastolic dysfunction (impaired relaxation).  2. Right ventricular systolic function is normal. The right ventricular size is normal. Tricuspid regurgitation signal is inadequate for assessing PA pressure.  3. The mitral valve is normal in structure. Trivial mitral valve  regurgitation. No evidence of mitral stenosis.  4. The aortic valve is tricuspid. Aortic valve regurgitation is not visualized. Mild aortic valve sclerosis is present, with no evidence of aortic valve stenosis. FINDINGS  Left Ventricle: Left ventricular ejection fraction, by estimation, is 55 to 60%. The left ventricle has normal function. The left ventricle has no regional wall motion abnormalities. The left ventricular internal cavity size was normal in size. There is  no left ventricular hypertrophy. Left ventricular diastolic parameters are consistent with Grade I diastolic dysfunction (impaired relaxation). Right Ventricle: The right ventricular size is normal. No increase in right ventricular wall thickness. Right ventricular systolic function is normal. Tricuspid regurgitation signal is inadequate for assessing PA pressure. Left Atrium: Left atrial size was normal in size. Right Atrium: Right atrial size was normal in size. Prominent Eustachian valve. Pericardium: There is no evidence of pericardial effusion. Mitral Valve: The mitral valve is normal in structure. Trivial mitral valve regurgitation. No evidence of mitral valve stenosis. MV peak gradient, 4.8 mmHg. The mean mitral valve gradient is 2.0 mmHg. Tricuspid Valve: The tricuspid valve is normal in structure. Tricuspid valve regurgitation is trivial. Aortic Valve: The aortic valve is tricuspid. . There is mild thickening of the aortic valve. Aortic valve regurgitation is not visualized. Mild aortic valve sclerosis is present, with no evidence of aortic valve stenosis. There is mild thickening of the aortic valve. Aortic valve mean gradient measures  4.0 mmHg. Aortic valve peak gradient measures 9.7 mmHg. Aortic valve area, by VTI measures 1.85 cm. Pulmonic Valve: The pulmonic valve was not well visualized. Pulmonic valve regurgitation is not visualized. No evidence of pulmonic stenosis. Aorta: The aortic root is normal in size and structure. Pulmonary  Artery: The pulmonary artery is not well seen. Venous: The inferior vena cava was not well visualized. IAS/Shunts: No atrial level shunt detected by color flow Doppler.  LEFT VENTRICLE PLAX 2D LVIDd:         4.15 cm  Diastology LVIDs:         2.74 cm  LV e' lateral:   6.85 cm/s LV PW:         0.92 cm  LV E/e' lateral: 9.2 LV IVS:        0.79 cm  LV e' medial:    6.74 cm/s LVOT diam:     2.00 cm  LV E/e' medial:  9.4 LV SV:         52 LV SV Index:   28 LVOT Area:     3.14 cm  RIGHT VENTRICLE RV Basal diam:  3.86 cm LEFT ATRIUM             Index       RIGHT ATRIUM           Index LA diam:        3.10 cm 1.66 cm/m  RA Area:     17.30 cm LA Vol (A2C):   42.7 ml 22.88 ml/m RA Volume:   51.30 ml  27.49 ml/m LA Vol (A4C):   50.3 ml 26.96 ml/m LA Biplane Vol: 47.0 ml 25.19 ml/m  AORTIC VALVE                   PULMONIC VALVE AV Area (Vmax):    1.89 cm    PV Vmax:       0.94 m/s AV Area (Vmean):   2.12 cm    PV Vmean:      58.600 cm/s AV Area (VTI):     1.85 cm    PV VTI:        0.166 m AV Vmax:           156.00 cm/s PV Peak grad:  3.5 mmHg AV Vmean:          91.200 cm/s PV Mean grad:  2.0 mmHg AV VTI:            0.278 m AV Peak Grad:      9.7 mmHg AV Mean Grad:      4.0 mmHg LVOT Vmax:         93.80 cm/s LVOT Vmean:        61.500 cm/s LVOT VTI:          0.164 m LVOT/AV VTI ratio: 0.59  AORTA Ao Root diam: 3.00 cm MITRAL VALVE MV Area (PHT): 4.60 cm    SHUNTS MV Peak grad:  4.8 mmHg    Systemic VTI:  0.16 m MV Mean grad:  2.0 mmHg    Systemic Diam: 2.00 cm MV Vmax:       1.10 m/s MV Vmean:      59.2 cm/s MV Decel Time: 165 msec MV E velocity: 63.10 cm/s MV A velocity: 99.00 cm/s MV E/A ratio:  0.64 Harrell Gave End MD Electronically signed by Nelva Bush MD Signature Date/Time: 05/23/2019/2:01:12 PM    Final     Medications:  I have reviewed the patient's  current medications. Scheduled: .  stroke: mapping our early stages of recovery book   Does not apply Once  . aspirin EC  81 mg Oral Daily  .  atorvastatin  80 mg Oral Daily  . clopidogrel  75 mg Oral Daily  . enoxaparin (LOVENOX) injection  40 mg Subcutaneous Q24H    Assessment/Plan: 84 y.o. male with medical history significant forhypertension, who presented to the emergency room with dizziness, unsteady gait and double vision.  Patient outside time window for tPA.  Symptoms improved with only some residual visual changes remaining.  MRI of the brain personally reviewed and shows an acute left temporal lobe periventricular infarct.  Small vessel disease progressed since 2014.  Likely secondary to small vessel disease based on MRA findings.  MRA shows multifocal stenoses with severe stenoses noted at the proximal right M2, mid left P2 and moderate stenosis at the right P2.  Echocardiogram shows no cardiac source of emboli with an EF of 55-60%.  A1c 5.8, LDL 164. Patient on no antiplatelet or statin therapy prior to admission.     Plan: 1. Statin for lipid management with target LDL<70. 2. Prophylactic therapy-Dual antiplatelet therapy with ASA 81mg  and Plavix 75mg  for three weeks with change to ASA 81mg  daily alone as monotherapy after that time. 3. Follow up with ophthalmology on an outpatient basis 4. No further neurologic intervention is recommended at this time.  If further questions arise, please call or page at that time.  Thank you for allowing neurology to participate in the care of this patient.    LOS: 1 day   Alexis Goodell, MD Neurology [phone] 05/24/2019  10:42 AM

## 2019-07-04 ENCOUNTER — Ambulatory Visit: Payer: Medicare Other | Attending: Internal Medicine | Admitting: Physical Therapy

## 2019-07-04 ENCOUNTER — Encounter: Payer: Self-pay | Admitting: Physical Therapy

## 2019-07-04 ENCOUNTER — Other Ambulatory Visit: Payer: Self-pay

## 2019-07-04 DIAGNOSIS — R262 Difficulty in walking, not elsewhere classified: Secondary | ICD-10-CM | POA: Insufficient documentation

## 2019-07-04 NOTE — Therapy (Signed)
[place] MAIN [facility] SERVICES [address] [place], [place], [address] Phone: [phone]   Fax:  [phone]  Physical Therapy Evaluation  Patient Details  Name: Caleb Reynolds MRN: [id number] Date of Birth: [birth year] Referring Provider (PT): Dr. Benita Stabile, PCP   Encounter Date: 07/04/2019  PT End of Session - 07/04/19 1014    Visit Number  1    Number of Visits  1    Date for PT Re-Evaluation  07/04/19    PT Start Time  0903    PT Stop Time  1000    PT Time Calculation (min)  57 min    Equipment Utilized During Treatment  Gait belt    Activity Tolerance  Patient tolerated treatment well    Behavior During Therapy  [facility] for tasks assessed/performed       Past Medical History:  Diagnosis Date  . Chronic kidney disease    kidney stones  . Hypertension     Past Surgical History:  Procedure Laterality Date  . COLONOSCOPY WITH PROPOFOL N/A 03/11/2016   Procedure: COLONOSCOPY WITH PROPOFOL;  Surgeon: Manya Silvas, MD;  Location: [facility] ENDOSCOPY;  Service: Endoscopy;  Laterality: N/A;  . ESOPHAGOGASTRODUODENOSCOPY (EGD) WITH PROPOFOL N/A 03/11/2016   Procedure: ESOPHAGOGASTRODUODENOSCOPY (EGD) WITH PROPOFOL;  Surgeon: Manya Silvas, MD;  Location: [facility] ENDOSCOPY;  Service: Endoscopy;  Laterality: N/A;    There were no vitals filed for this visit.   Subjective Assessment - 07/04/19 0903    Subjective  "If I were to rate my dizziness on a 0-10 scale it would be about a 1-2/10"    Pertinent History  84 yo Male s/p CVA on early April 2021 with acute dizziness, impaired vision and ataxia; He reports a lot of symptoms have resolved but he feels like he may still have some mild dizziness/unsteadiness but it is very mild; Patient is very active and prior to CVA he was walking 2 miles a day 4 days a week; Since CVA he has been up to 2 miles a day 3-4 days a week; In last week he has walked a total of 9 miles; He presents to therapy  without AD and states that he doesn't use it. He does report occasionally have ache in calf/achilles with long distance walking which is relieved with stretching; Denies any shortness of breath; Denies any numbness/tingling;    Limitations  Walking    How long can you sit comfortably?  NA    How long can you stand comfortably?  1 hour    How long can you walk comfortably?  2 miles or more    Diagnostic tests  MRI showed acute left temporal periventricular infarct.    Patient Stated Goals  would like to get back to jogging again;"Walking is just so boring"  He states its been a decade since he was jogging; He is up to walking at a 22 min mile;    Currently in Pain?  No/denies    Multiple Pain Sites  No         [facility] PT Assessment - 07/04/19 0001      Assessment   Medical Diagnosis  CVA    Referring Provider (PT)  Dr. Benita Stabile, PCP    Onset Date/Surgical Date  05/11/19    Hand Dominance  Right    Next MD Visit  July 2021    Prior Therapy  denies any PT for this condition      Precautions  Precautions  None      Restrictions   Weight Bearing Restrictions  No      Balance Screen   Has the patient fallen in the past 6 months  No    Has the patient had a decrease in activity level because of a fear of falling?   No    Is the patient reluctant to leave their home because of a fear of falling?   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;     Leisure  running, being with people, working;       Charity fundraiser Status  Within Functional Limits for tasks assessed      Observation/Other Assessments   Observations  very pleasant gentleman, active      Focus on Therapeutic Outcomes (FOTO)   82/100      Sensation   Light Touch  Appears Intact    Proprioception  Appears Intact      Coordination   Gross Motor Movements are Fluid and Coordinated  Yes    Fine Motor Movements are Fluid and Coordinated  Yes      Posture/Postural Control   Posture/Postural Control  No significant limitations      ROM / Strength   AROM / PROM / Strength  AROM;Strength      AROM   Overall AROM Comments  BUE and BLE are WFL    Right Ankle Dorsiflexion  8    Right Ankle Plantar Flexion  45    Right Ankle Inversion  35    Right Ankle Eversion  8    Left Ankle Dorsiflexion  12    Left Ankle Plantar Flexion  40    Left Ankle Inversion  35    Left Ankle Eversion  12      Strength   Overall Strength Comments  BUE and BLE are WFL      Transfers   Comments  able to transfer independently      Ambulation/Gait   Gait Comments  ambulates with normal gait pattern      Standardized Balance Assessment   10 Meter Walk  1.5 m/s without AD, normal, no fall risk      High Level Balance   High Level Balance Comments  able to hold SLS for 15 sec each LE with mild unsteadiness noted      Functional Gait  Assessment   Gait assessed   Yes    Gait Level Surface  Walks 20 ft in less than 5.5 sec, no assistive devices, good speed, no evidence for imbalance, normal gait pattern, deviates no more than 6 in outside of the 12 in walkway width.    Change in Gait Speed  Able to smoothly change walking speed without loss of balance or gait deviation. Deviate no more than 6 in outside of the 12 in walkway width.    Gait with Horizontal Head Turns  Performs head turns smoothly with slight change in gait velocity (eg, minor disruption to smooth gait path), deviates 6-10 in outside 12 in walkway width, or uses an assistive device.  Gait with Vertical Head Turns  Performs task with slight change in gait velocity (eg, minor disruption to smooth gait path), deviates 6 - 10 in  outside 12 in walkway width or uses assistive device    Gait and Pivot Turn  Pivot turns safely within 3 sec and stops quickly with no loss of balance.    Step Over Obstacle  Is able to step over 2 stacked shoe boxes taped together (9 in total height) without changing gait speed. No evidence of imbalance.    Gait with Narrow Base of Support  Ambulates 4-7 steps.    Gait with Eyes Closed  Walks 20 ft, uses assistive device, slower speed, mild gait deviations, deviates 6-10 in outside 12 in walkway width. Ambulates 20 ft in less than 9 sec but greater than 7 sec.    Ambulating Backwards  Walks 20 ft, uses assistive device, slower speed, mild gait deviations, deviates 6-10 in outside 12 in walkway width.    Steps  Alternating feet, must use rail.    Total Score  23    FGA comment:  low risk for falls                  Objective measurements completed on examination: See above findings.              PT Education - 07/04/19 1014    Education Details  plan of care/recommendations    Person(s) Educated  Patient    Methods  Explanation    Comprehension  Verbalized understanding          PT Long Term Goals - 07/04/19 1018      PT LONG TERM GOAL #1   Title  Patient will verbalize understanding of recommendations and will continue to pursue active lifestyle independently.    Time  1    Period  Days    Status  Achieved    Target Date  07/04/19             Plan - 07/04/19 1014    Clinical Impression Statement  84 yo Male s/p CVA in early April 2021 reports doing well. Initially he experienced dizziness and double vision but states that has mostly resolved. He is very active walking 2 miles day and is working part-time as a Contractor. Patient expressed experiencing some soreness in left calf after walking a mile which is relieved with stretches. Otherwise he denies any discomfort or limitations. Patient exhibits functional strength in BUE/BLE and functional ROM. No  limitations in ankle ROM. Patient did test as a low fall risk but did exhibit some unsteadiness with dynamic walking with head turns and tandem gait. He was able to hold SLS for 15 sec each LE. While patient may benefit from a few PT visits for HEP for dynamic balance, he expressed desire to be discharged to focus on walking and trying to return to jogging at home. Patient does not exhibits need for skilled intervention at this time.    Personal Factors and Comorbidities  Comorbidity 1    Comorbidities  HTN, recent stroke    Stability/Clinical Decision Making  Stable/Uncomplicated    Clinical Decision Making  Low    PT Frequency  One time visit    Consulted and Agree with Plan of Care  Patient       Patient will benefit from skilled therapeutic intervention in order to improve the following deficits and impairments:  Difficulty walking  Visit Diagnosis: Difficulty in walking, not  elsewhere classified     Problem List Patient Active Problem List   Diagnosis Date Noted  . CVA (cerebral vascular accident) ([place]) 05/23/2019  . Acute CVA (cerebrovascular accident) ([place]) 05/22/2019  . Essential hypertension 05/22/2019    Heyli Min PT, DPT 07/04/2019, 10:18 AM  [place] MAIN [facility] SERVICES [address] [place], [place], [address] Phone: [phone]   Fax:  [phone]  Name: KENDRY FERRAND MRN: [id number] Date of Birth: 11-Dec-1935

## 2019-07-06 ENCOUNTER — Ambulatory Visit: Payer: Medicare Other | Admitting: Physical Therapy

## 2019-07-11 ENCOUNTER — Ambulatory Visit: Payer: Medicare Other | Admitting: Physical Therapy

## 2019-07-13 ENCOUNTER — Ambulatory Visit: Payer: Medicare Other | Admitting: Physical Therapy

## 2019-07-18 ENCOUNTER — Ambulatory Visit: Payer: Medicare Other | Admitting: Physical Therapy

## 2019-07-24 ENCOUNTER — Ambulatory Visit: Payer: Medicare Other | Admitting: Physical Therapy

## 2019-07-26 ENCOUNTER — Ambulatory Visit: Payer: Medicare Other | Admitting: Physical Therapy

## 2019-08-01 ENCOUNTER — Ambulatory Visit: Payer: Medicare Other | Admitting: Physical Therapy

## 2019-08-03 ENCOUNTER — Ambulatory Visit: Payer: Medicare Other | Admitting: Physical Therapy

## 2019-08-08 ENCOUNTER — Ambulatory Visit: Payer: Medicare Other | Admitting: Physical Therapy

## 2019-08-10 ENCOUNTER — Ambulatory Visit: Payer: Medicare Other | Admitting: Physical Therapy

## 2021-12-06 IMAGING — MR MR HEAD W/O CM
16 of 18 series · 36 of 48 positions shown · IV contrast (gadavist)
Comparison: Head CT 05/22/2019, brain MRI 06/24/2012

CLINICAL DATA: Focal neuro deficit, greater than 6 hours, stroke
suspected. Additional history provided: Patient reports dizziness,
double vision, difficulty walking due to difficulty keeping balance,
onset last night, symptoms continue.

EXAM:
MR HEAD WITHOUT CONTRAST
MR CIRCLE OF WILLIS WITHOUT CONTRAST
MRA OF THE NECK WITHOUT AND WITH CONTRAST
TECHNIQUE: Multiplanar, multiecho pulse sequences of the brain, circle of
willis and surrounding structures were obtained without intravenous
contrast. Angiographic images of the neck were obtained using MRA
technique without and with intravenous contrast.
CONTRAST:  6mL GADAVIST GADOBUTROL 1 MMOL/ML IV SOLN

[Series 5: ax dwi_tracew · axial · 3.0mm · 0.60mm/px · z∈[-56,+99]mm · 3 of 48 slices shown]
[im 1/48]
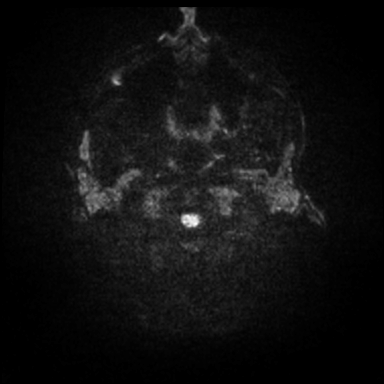
[im 24/48]
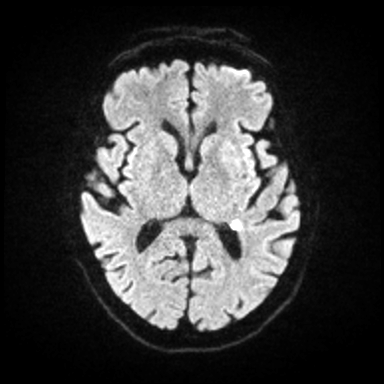
[im 48/48]
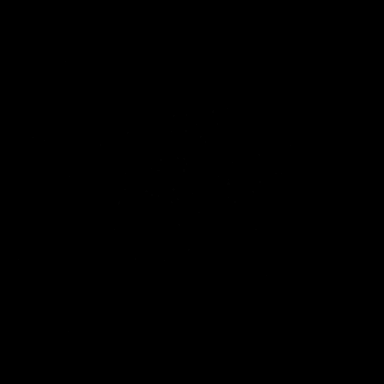

[Series 6: ax dwi_adc · axial · 3.0mm · 0.60mm/px · z∈[-56,+92]mm · 2 of 46 slices shown]
[im 1/46]
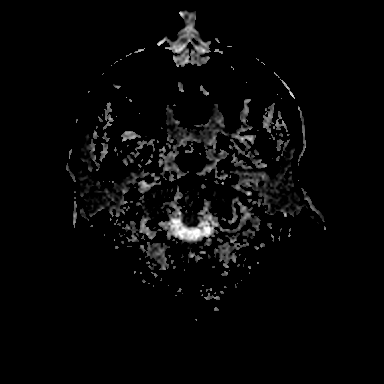
[im 46/46]
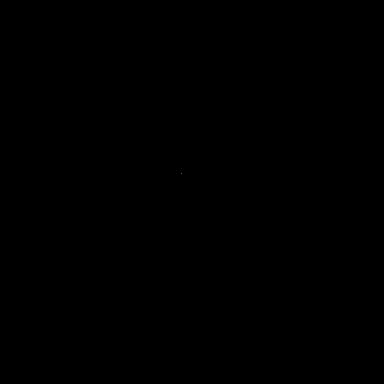

[Series 7: cor dwi_tracew · coronal · 5.0mm · 0.60mm/px · 1 of 40 slices shown]
[im 1/40]
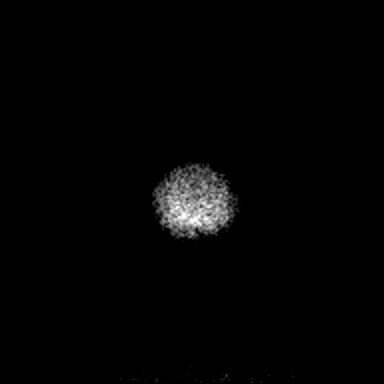

[Series 8: cor dwi_adc · coronal · 5.0mm · 0.60mm/px · 1 of 40 slices shown]
[im 1/40]
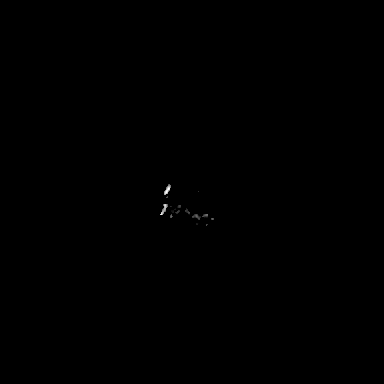

[Series 14: T1 · sagittal · 5.0mm · 0.62mm/px · 1 of 25 slices shown (1 of 2)]
[im 1/25]
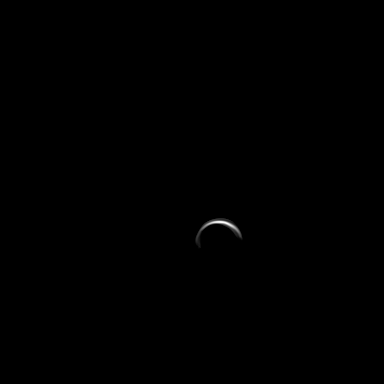

[Series 15: T2 · axial · 5.0mm · 0.53mm/px · 1 of 25 slices shown (1 of 2)]
[im 1/25]
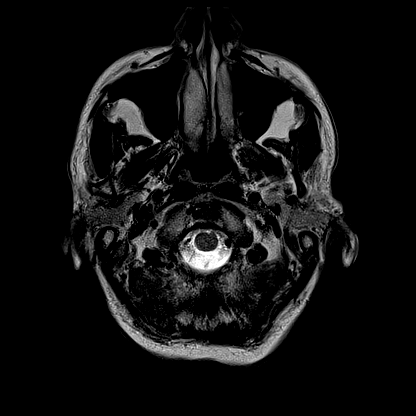

[Series 17: pha_images · axial · 3.0mm · 0.90mm/px · z∈[-67,+104]mm · 2 of 56 slices shown]
[im 1/56]
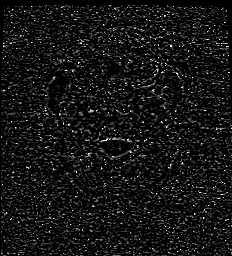
[im 56/56]
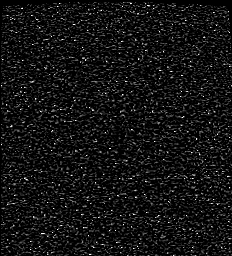

[Series 18: swi_images · axial · 3.0mm · 0.90mm/px · z∈[-67,+110]mm · 2 of 60 slices shown]
[im 1/60]
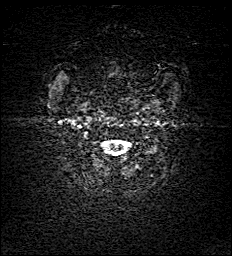
[im 60/60]
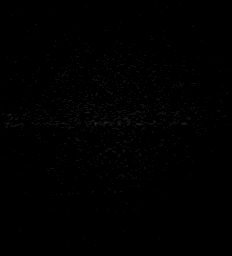

[Series 20: FLAIR · axial · 3.0mm · 0.53mm/px · z∈[-60,+102]mm · 2 of 55 slices shown]
[im 1/55]
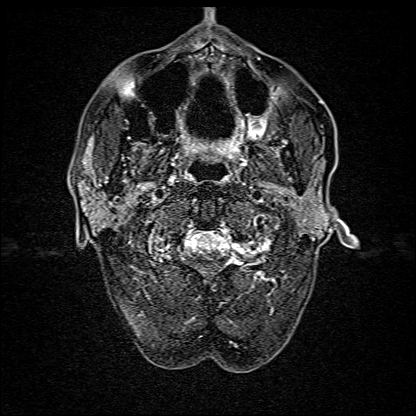
[im 55/55]
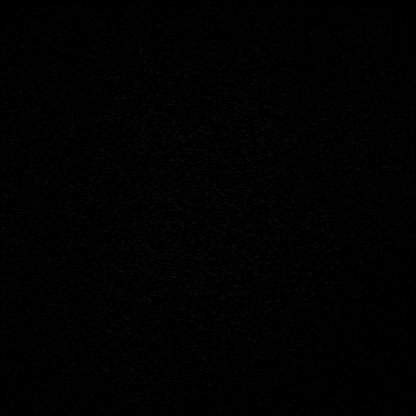

[Series 21: T1 · axial · 1.0mm · 0.98mm/px · z∈[-65,+109]mm · 6 of 172 slices shown (2 of 2)]
[im 1/172]
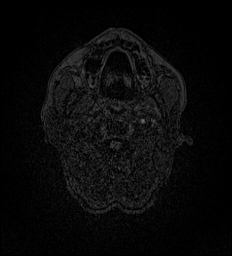
[im 35/172]
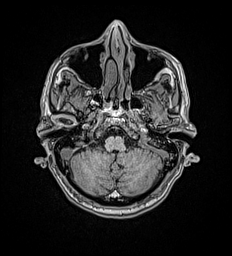
[im 69/172]
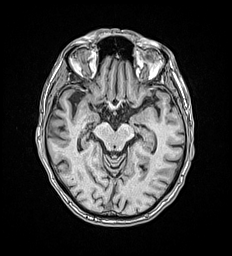
[im 103/172]
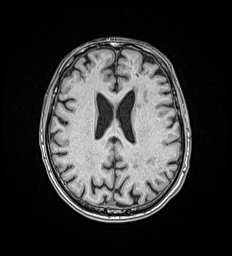
[im 137/172]
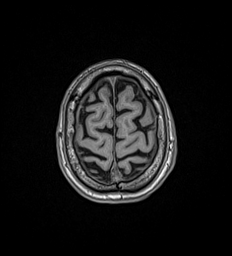
[im 172/172]
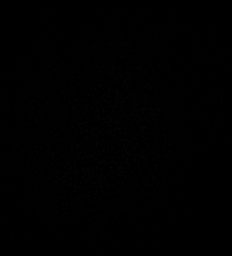

[Series 22: T2 · coronal · 5.0mm · 0.57mm/px · 1 of 29 slices shown (2 of 2)]
[im 1/29]
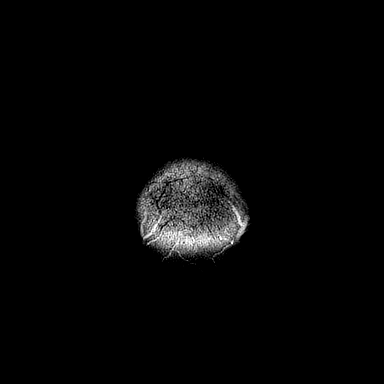

[Series 32: t1_se_fs_tra_blood-suppr. · axial · 2.0mm · 0.62mm/px · z∈[-198,-101]mm · 2 of 45 slices shown]
[im 1/45]
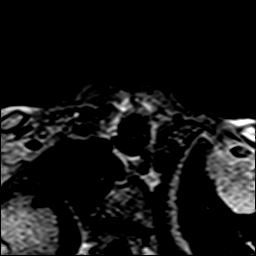
[im 45/45]
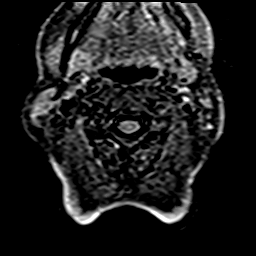

[Series 33: angio_fl3d_cor_pre_ttc=2.0s · coronal · 0.9mm · 0.85mm/px · 3 of 96 slices shown]
[im 1/96]
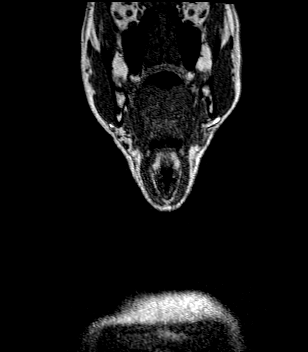
[im 48/96]
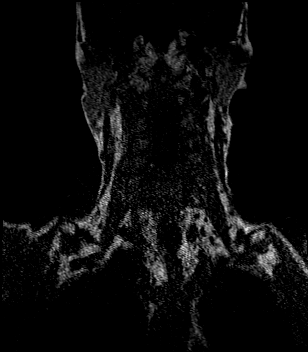
[im 96/96]
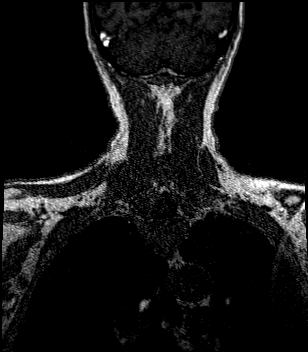

[Series 35: angio_fl3d_cor_post_ttc=2.0s · coronal · 0.9mm · 0.85mm/px · 3 of 95 slices shown]
[im 1/95]
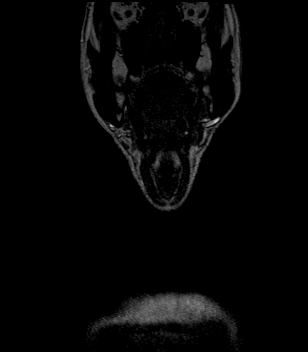
[im 48/95]
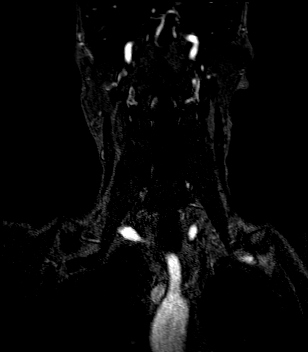
[im 95/95]
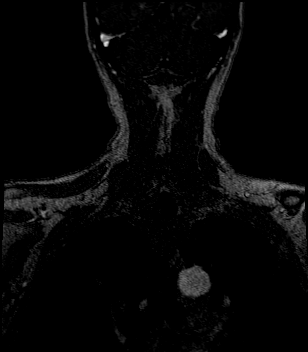

[Series 36: angio_fl3d_cor_post_ttc=2.0s_moco-adv · coronal · 0.9mm · 0.85mm/px · 3 of 94 slices shown]
[im 1/94]
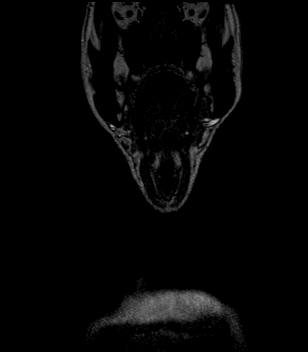
[im 47/94]
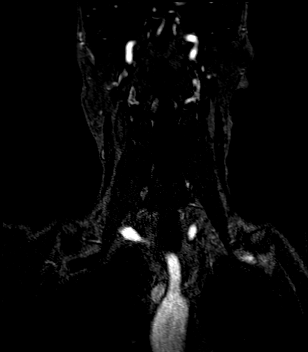
[im 94/94]
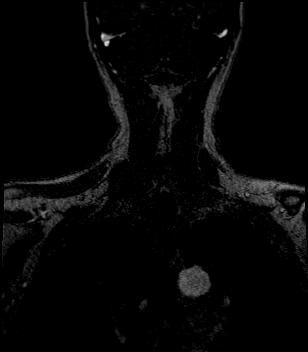

[Series 37: angio_fl3d_cor_post_ttc=2.0s_moco-adv_sub · coronal · 0.9mm · 0.85mm/px · 3 of 90 slices shown]
[im 1/90]
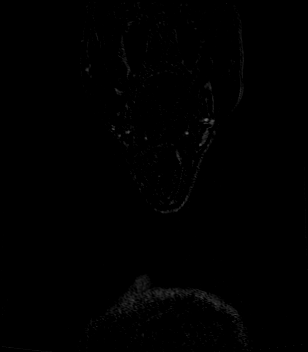
[im 45/90]
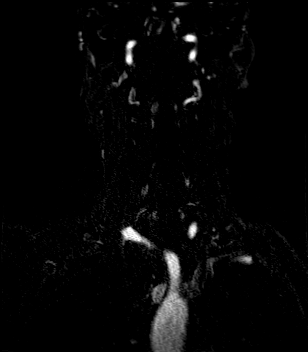
[im 90/90]
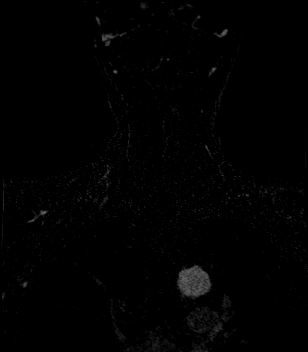

[36 of 48 positions shown; findings below may reference images not displayed]

FINDINGS: MR HEAD FINDINGS

Brain:

There is a 2 cm focus of restricted diffusion within the
periventricular left temporal lobe, along the atrium and temporal
horn of the left lateral ventricle. Findings are consistent with
acute infarction. Corresponding T2/FLAIR hyperintensity at this
site.

There is no evidence of acute infarct elsewhere within the brain. No
evidence of intracranial mass. No midline shift or extra-axial fluid
collection. No chronic intracranial blood products.

Mild for age scattered T2/FLAIR hyperintensity within the cerebral
white matter has progressed from MRI 06/24/2012. Findings are
nonspecific, but consistent with chronic small vessel ischemic
disease. Sever tiny chronic lacunar infarcts within the left
cerebellar hemisphere were not present on this prior MRI. Stable,
mild generalized parenchymal atrophy.

Vascular: Reported below.

Skull and upper cervical spine: No focal marrow lesion.

Sinuses/Orbits: Visualized orbits demonstrate no acute abnormality.
Mild ethmoid sinus mucosal thickening. Trace fluid within bilateral
mastoid air cells.

MR CIRCLE OF WILLIS FINDINGS

The intracranial internal carotid arteries are patent without
significant stenosis. The M1 middle cerebral arteries are patent
without significant stenosis. Apparent high-grade focal stenosis
within a proximal M2 right MCA branch vessel (series 6805, image
13). No other high-grade proximal M2 stenosis is identified. The
anterior cerebral arteries are patent without high-grade proximal
stenosis. No intracranial aneurysm is identified.

The non dominant intracranial right vertebral artery is patent and
appears to terminate predominantly as the right PICA. The dominant
intracranial left vertebral artery is patent without significant
stenosis, as is the basilar artery. Predominantly fetal origin of
the right posterior cerebral artery. The posterior cerebral arteries
are patent bilaterally. Sites of up to moderate stenosis within the
proximal P2 right PCA. Moderate to moderately severe focal stenosis
within the mid P2 left PCA (series 2022, image 5). Atherosclerotic
irregularity of distal left PCA branches is also noted. A small left
posterior communicating artery is present.

MRA NECK FINDINGS

Standard aortic branching. No hemodynamically significant stenosis
of the innominate or proximal subclavian arteries.

The bilateral common and internal carotid arteries are patent
without significant stenosis (50% or greater).

The vertebral arteries are patent within the neck bilaterally with
antegrade flow. There is limited assessment for stenoses within the
non dominant cervical right vertebral artery due to developmental
small vessel size. There are multifocal sites of apparent moderate
narrowing within the V1 and V2 segments of the dominant cervical
left vertebral artery on the postcontrast imaging. However, these
stenoses may be accentuated by motion artifact.
IMPRESSION: MRI brain:

1. 2 cm acute infarct within the periventricular left temporal lobe.
2. Mild chronic small vessel ischemic changes within the cerebral
white matter, progressed as compared MRI 06/24/2012. Several tiny
chronic lacunar infarcts within the left cerebellar hemisphere were
not present on this prior exam.
3. Stable, mild generalized parenchymal atrophy.
4. Mild ethmoid sinus mucosal thickening. Trace fluid within
bilateral mastoid air cells.

MRA head:

1. Intracranial atherosclerotic disease with multifocal stenoses,
most notably as follows.
2. Apparent severe focal stenosis within a proximal M2 right MCA
branch vessel.
3. Moderate to moderately severe focal stenosis within the mid P2
left posterior cerebral artery.
4. Sites of up to moderate stenosis within the proximal P2 right
PCA.

MRA neck:

1. The bilateral common and internal carotid arteries are patent
within the neck without significant stenosis.
2. The vertebral arteries are patent within the neck bilaterally
with antegrade flow.
3. There is limited assessment for stenoses within the non dominant
cervical right vertebral artery due to developmental small vessel
size.
4. There are apparent moderate stenoses within the V1 and V2
segments of the dominant cervical left vertebral artery on the
post-contrast imaging. However, the stenoses may be accentuated by
motion artifact.

## 2022-11-09 NOTE — Progress Notes (Signed)
 Ref Provider: Dr. Corlis, Honor BROCKS, MD PCP: Dr. Corlis, Honor BROCKS, MD Assessment and Plan:   In most patients we give written parts of assessment and plan to patient under Patient Instructions/After Visit Summary. So some parts are directed to patient.  Dear Caleb Reynolds, It was our pleasure to participate in your care. We have typed up brief summary of what we discussed.  1. Late onset mild dementia - Currently lives alone with family very near by. He is working as a chartered loss adjuster. He is active in a dancing club and performs 30 minutes of exercise on a stationary bike, has reduced exercise lately, discussed program like silver sneakers. He works on lawyer. His son helps him to look after his finances. He has not had trouble with taking his medications regularly. Able to recite the alphabet backwards.  SLUMS 17/30 05/02/2021.   - Refill/Continue rivastigmine  1.5 mg by mouth twice a day with meals (one pill with breakfast and one pill with dinner). The patient should know the etiology, pathogenesis, and progression of different types of dementia. We considered treatable causes of slowly progressive cognitive dysfunction e.g. thyroid disease, vitamin B12 deficiency, pseudo-dementia of depression, etc. We considered neurodegenerative dementias such as frontotemporal dementia, corticobasal degeneration, progressive supranuclear palsy, Lewy body dementia.  Patients and families should consider looking at the vast amount of information available online e.g. limitlaws.hu. Patients with dementia should not have access to firearms due to unpredictable nature of the disease course.   - Alzheimer's and dementia is preventable! Look into my website [email or web address].com for information on dementia and Alzheimer prevention and ways to slow the progression of the disease. Some life style modifications you can make: maintaining a whole food plant based diet, being physically active, getting 7-8  hours of sleep a night, managing stress, engaging in to challenging mental activities, and being socially active.  We recommend a diet consisting of a variety of green leafy vegetables, fruits, nuts and other whole grain food. Avoid ultra-processed food, soda and artifical sweeteners.   Music is beneficial for improving attention, memory, mood, and problem solving abilities by stimulating the mind.  Playing music and learning a new instrument improves cognitive function at all ages and keeps the brain engaged.   - Discussed neuroplasticity of the brain- Neuroplasticity refers to the way our brains are able grow and make new neural connections even in our old age. To help improve your cognition, you should be exercising your brain. This can be done by writing some of your old stories, by doing art, or doing things that challenge your brain.  I am very impressed you can say the alphabet backswords! Now try mapping out how to get too and from different locations (the grocery store, the gym, the gas station). Try thinking of at least 3-4 different ways to get to where you want to go. This will help you become more confident in your driving and will make it less likely you will get lost while driving.   - Discussed cognitive reserve- cognitive reserve is your brain's ability to find new ways of preforming a task. By frequently exercising your brain, you are strengthening your cognitive reserve. In the event that your brain gets damage from dementia or Alzheimer's disease, you will have enough to cognitive reserve that the damage may not even be noticeable in your day to day life.   Discussed dementia risk factors such as high blood pressure, unhealthy gut micro-biome, and insufficient resistance to virus infections.  2. Slight hunched forward posture while walking with decreased right arm swing. Low suspicion for parkinsonism.  3. History of Acute mental status change with increased confusion - based on  exam patient's impairment is more with perception of time. Patient has a history of left temporal lobe infarct, ?residual symptoms of previous stroke. Cannot rule out other causes such as severely low hemoglobin based on symptoms of abdominal pain, black stool and changes in bowel habits - symptoms improved following hospitalization for GI hemorrhage - without recurrence of altered mentation/episodes of confusion 4. History of stroke - Denies new stroke or stroke like episodes  - Continue Aspirin  EC 81 mg by mouth once a day (daily) for secondary stroke prevention  - Continue taking Crestor  daily    Follow up in 1 year with [name], PA-C  Return in about 1 year (around 11/09/2023) for [name], PA-C.   Interim History date 11/09/2022   Caleb Reynolds is a 87 y.o. male here for treatment and evaluation of Memory Loss, accompanied by loved one.   Caleb Reynolds last visit was on 11/07/2021  Patient reports that things have been stable. Still some short term memory loss but it does not seem to be worse. Patient is able to manage everything on his own but he does not have confidence so he just has his children help. No concerns for safety, cognition, comprehension, or driving (he passed DMV test). Taking Exelon  1.5 mg two times a day. He does report some frequent urination mostly during the day but loved one does report he drinks a lot of water . He is keeping social (goes to [employer], etc.).  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 Patient is into [employer]    Disease Summary: (Aggregate of information from previous visits)   Caleb Reynolds is a 87 y.o. male  here for evaluation of Memory Loss  Late onset mild dementia: States patient is highly functional, and continues to work without difficulty. Patient continues to have difficulty with short-term memory, though notes mild improvement. Patient and family report emphasizing routine. Also note patient has gained weight  (approximately 9 lbs). States he is not as active as he used to be, and is interested in physical therapy and/or an exercise program. Denies safety concerns - son states as far as his ability to reason, he is doing well. Son states patient is able to recognize phishing calls and discuss them with son. Reports some boredom due to inactivity, though he remains active in his community and at work. Patient denies anxiety, hallucinations, depression. Reports he is sleeping well. Son endorses an adequate support system through family and work. Per patient's son he noticed regression of improvement around the time of the anniversary of his wife's passing.   04/2021: He also has not noticed significant benefit or side effects with the increase in his donepezil. He currently lives alone with family very near by. He is working as a chartered loss adjuster. He is active in a dancing club and performs 30 minutes of exercise on a stationary bike 3-7 times per week. He works on lawyer. His son helps him to look after his finances. He has not had trouble with taking his medications regularly.  SLUMS 05/02/2021: 17/30   Medications: Donepezil (ineffective), Exelon    He has slight hunched forward posture while walking with decreased right arm swing. Low suspicion for parkinsonism.   History of acute mental status change with increased confusion - based on  exam patient's impairment is more with perception of time. Patient has a history of left temporal lobe infarct, ?residual symptoms of previous stroke. Cannot rule out other causes such as severely low hemoglobin based on symptoms of abdominal pain, black stool and changes in bowel habits - symptoms improved following hospitalization for GI hemorrhage - without recurrence of altered mentation/episodes of confusion: Patient states 11/13/19 patient had sudden onset of memory loss. He does not remember anything in the past month. Patient has not had any new visual  changes. Patient's wife passed away last year ( around September 2020). She was cremated and family had planned to bury her ashes this weekend. Son was unsure if this was a trigger for patient's symptoms.   Family reported increased bowel movements, patient did report some black stool and abdominal pain. He has a history of GI bleed.   Routine EEG (electroencephalography) 02/16/2020 - This is an awake and sleep abnormal EEG due to presence of intermittent left temporal > bi temporal theta slowing was noted.   Discussed ordering prolonged EEG - will hold off at this time as there is low concern this is epileptic in nature + patient has had improvement in symptoms.    History of stroke: Patient went to ER for dizziness, double vision, and ataxia.  This had been going on for over 48 hours before going to ER. These symptoms lasted a couple days.  States he still has some very mild dizziness.  Is now on aspirin  and plavix .   Was not on this prior to this stroke.  This is first known stroke.  States he had a spell several years ago he had LOC that was short lasting.  States that he feels ok, mild dizziness not interfering with daily activities.  He is working and walking about 2 miles about 4 days a week.  MRI brain without contrast 05/22/2019 - 2 cm acute infarct within the periventricular left temporal lobe. Mild chronic small vessel ischemic changes within the cerebral white matter, progressed as compared MRI 06/24/2012. Several tiny chronic lacunar infarcts within the left cerebellar hemisphere were not present on this prior exam. Stable, mild generalized parenchymal atrophy. Mild ethmoid sinus mucosal thickening. Trace fluid within bilateral mastoid air cells.   MRA Head without contrast 05/22/2019 - intracranial atherosclerotic disease with multifocal stenoses, most notably as follows. Apparent severe focal stenosis within a proximal M2 right MCA branch vessel. Moderate to moderately severe focal stenosis  within the mid P2 left posterior cerebral artery. Sites of up to moderate stenosis within the proximal P2 right PCA.   MRA neck with and without contrast 05/22/2019 - The bilateral common and internal carotid arteries are patent within the neck without significant stenosis. The vertebral arteries are patent within the neck bilaterally with antegrade flow. There is limited assessment for stenoses within the non dominant cervical right vertebral artery due to developmental small vessel size. There are apparent moderate stenoses within the V1 and V2 segments of the dominant cervical left vertebral artery on the post-contrast imaging. However, the stenoses may be accentuated by motion artifact.    Medications: Aspirin , Crestor   Physical Exam   Vitals Vitals:   11/09/22 1402  BP: (!) 140/88  Pulse: 89  SpO2: 98%  Weight: 71.5 kg (157 lb 9.6 oz)  Height: 180.3 cm (5' 11)     Body mass index is 21.98 kg/m.  (Some of the exam changes noted are from previous clinical observations)  General Exam Patient was laying down for  his appointment due to dizziness (11/2019) Carotid bruit  Heart murmur   Neurological Exam Date recall (11/17/2019) Incorrect month (patient said November)  Correctly recalled home address, city and zip code  Correctly recalled phone number  Correctly recalled number of children  Correctly recalled number of grandchildren   11/01/2020 Correct - President, date Mr. Truxillo was asked to count months of the year backward - Patient was able to do it but took long time and hesitated.  05/02/2021 Slight hunched forward posture Slight decreased arm swing on the right  Medications: Current Outpatient Medications on File Prior to Visit  Medication Sig Dispense Refill   amLODIPine (NORVASC) 10 MG tablet Take 10 mg by mouth.     aspirin  81 MG EC tablet Take by mouth     multivitamin tablet Take 1 tablet by mouth once daily     rosuvastatin  (CRESTOR ) 20 MG tablet  Take 20 mg by mouth once daily     No current facility-administered medications on file prior to visit.   Past Medical History:  Past Medical History:  Diagnosis Date   History of stroke    Hyperlipidemia    Hypertension    Kidney stones     Past Surgical History:  Past Surgical History:  Procedure Laterality Date   COLONOSCOPY  03/11/2016   Adenomatous Polyp: No repeat due to age per RTE (dw)   EGD  03/11/2016   Gastritis: No repeat per RTE   ECSW     REPAIR INGUINAL HERNIA     Family History:  Family History  Problem Relation Name Age of Onset   High blood pressure (Hypertension) Mother     High blood pressure (Hypertension) Father     Colon polyps Neg Hx     Colon cancer Neg Hx     Social History:  Social History   Socioeconomic History   Marital status: Widowed  Tobacco Use   Smoking status: Former   Smokeless tobacco: Never  Vaping Use   Vaping status: Never Used  Substance and Sexual Activity   Alcohol use: No   Drug use: No   Sexual activity: Defer   Allergies: No Known Allergies  Jannett Fairly, MD

## 2023-12-22 NOTE — Progress Notes (Signed)
 Chief Complaint  Patient presents with   Establish Care    HPI  Caleb Reynolds is a 88 y.o. male new patient here to establish care for f/u of chronic medical issues (former PCP Dr. Corlis)  HTN: No acute issues.  Tolerating meds without adverse effects.  Unable to recall home blood pressures.  Denies HAs, vision changes, dizziness, cp, or syncopal episodes.  History of CVA (05/2019): No residual deficits but does have late onset Alzheimer's dementia followed by Dr. Maree.  No physical deficits.  Currently on aspirin  without bleeding issues.  Has had history of duodenal ulcers.  HLD: No acute issues.  Tolerating medications without adverse effects.  Denies any myalgia.  No cp or CVA symptoms.   Borderline DM: No acute issues.  No meds.  Asymptomatic.  Not checking CBGs.  ROS Review of systems is unremarkable for any active cardiac, respiratory, GI, GU, hematologic, neurologic, dermatologic, HEENT, or psychiatric symptoms except as noted above.  No fevers, chills, or constitutional symptoms.   Current Outpatient Medications  Medication Sig Dispense Refill   amLODIPine (NORVASC) 10 MG tablet Take 10 mg by mouth.     aspirin  81 MG EC tablet Take by mouth     multivitamin tablet Take 1 tablet by mouth once daily     rivastigmine  tartrate (EXELON ) 1.5 MG capsule Take 1 capsule (1.5 mg total) by mouth 2 (two) times daily with meals for 61 days 122 capsule 0   rosuvastatin  (CRESTOR ) 20 MG tablet Take 20 mg by mouth once daily     No current facility-administered medications for this visit.    Allergies as of 12/22/2023   (No Known Allergies)    Patient Active Problem List  Diagnosis   History of stroke (05/2019)   Essential hypertension   Mild late onset Alzheimer's dementia without behavioral disturbance, psychotic disturbance, mood disturbance, or anxiety (CMS-HCC) - followed by Dr. Maree   Pure hypercholesterolemia   Borderline diabetes mellitus    Past Medical  History:  Diagnosis Date   History of stomach ulcers    History of stroke    Hyperlipidemia    Hypertension    Kidney stones     Past Surgical History:  Procedure Laterality Date   COLONOSCOPY  03/11/2016   Adenomatous Polyp: No repeat due to age per RTE (dw)   EGD  03/11/2016   Gastritis: No repeat per RTE   ECSW     REPAIR INGUINAL HERNIA      Vitals:   12/22/23 1033  BP: 118/64  Pulse: 85  SpO2: 97%  Weight: 66.8 kg (147 lb 3.2 oz)  Height: 172 cm (5' 7.72)  PainSc: 0-No pain   Body mass index is 22.57 kg/m.  Exam  General. Well appearing pleasant elderly male accompanied by son; NAD; VS reviewed     Eyes. Sclera and conjunctiva clear; Vision grossly intact; extraocular movements intact Neck. Supple.  Lungs. Respirations unlabored; clear to auscultation bilaterally Cardiovascular. Heart regular rate and rhythm without murmurs, gallops, or rubs Abdomen. Soft; non tender; non distended; no masses or organomegaly Extremities. no edema Skin. Normal color and turgor Neurologic. Alert CN 2-12 grossly intact; no acute focal deficits  Assessment and Plan  1. Essential hypertension Well-controlled.  Assess renal function and electrolytes on Monday.  No changes to medication at this time.  Monitor blood pressure twice a week with goal less than 140/90. -     CBC w/auto Differential (5 Part); Future  2. Borderline diabetes mellitus Unchanged.  No meds.  A1c on Monday.  Focus on low sugar/starch diet and increase exercise if tolerated. -     Hemoglobin A1C; Future  3. Pure hypercholesterolemia Unchanged.  FLP and hepatic function on Monday.  Adjust treatment accordingly.  Focus on Mediterranean diet and exercise if tolerated. -     Comprehensive Metabolic Panel (CMP); Future -     Lipid Panel w/calc LDL; Future  4. History of stroke (05/2019) Stable.  No residual deficits except for late onset Alzheimer's dementia followed by Dr. Maree.  Continue with aspirin   therapy and focus on controlling cardiovascular risk factors.  Other orders -     Follow up in Primary Care; Future  F/u 6 months for recheck pending labs next Monday.  ALDA CARPEN, MD

## 2023-12-28 NOTE — Telephone Encounter (Signed)
 Labs scheduled and appointment card mailed

## 2023-12-28 NOTE — Telephone Encounter (Signed)
-----   Message from [facility] sent at 12/28/2023  3:24 PM EST ----- Patient's son [name] notified per release and he states understanding. Labs ordered.  ----- Message ----- From: Alla Amis, MD Sent: 12/28/2023   9:00 AM EST To: [name], CMA  Please inform patient results.  CMET within normal limits.  CBC within normal limits.  Borderline diabetes stable with A1c 6%.  Cholesterol at goal with LDL 55.  No changes to regimen.  Repeat same  labs 1 week prior to next visit. ----- Message ----- From: Lab, Background User Sent: 12/27/2023   9:32 AM EST To: Amis Alla, MD

## 2024-01-10 NOTE — Progress Notes (Signed)
 Ref Provider: Dr. Maree, Jannett Bong* PCP: Dr. Alla Amis, MD Assessment and Plan:   In most patients we give written parts of assessment and plan to patient under Patient Instructions/After Visit Summary. So some parts are directed to patient.  Dear Mr. Caleb Reynolds, It was our pleasure to participate in your care. We have typed up brief summary of what we discussed.  Assessment & Plan Alzheimer's disease with late onset Well-managed with no significant changes in memory since the last visit. No stroke-like symptoms, falls, or neurological changes. Continues to engage in activities such as dancing and working, which are neuroprotective. Medication management is supported by a pill box and routine refills. Financial management is assisted by family to reduce stress. Driving is safe with no recent accidents or getting lost. Continues to participate in social activities and maintains a good support system at home. - Sent 90-day refill for Rivastigmine  to [facility]. - Refill/Continue rivastigmine  1.5 mg by mouth twice a day with meals (one pill with breakfast and one pill with dinner). - Encouraged continuation of physical activity, mental stimulation, and social engagement. - Supported family assistance with financial management to reduce stress.  [employer] offers LIFE classes to anyone over the age of 72. A year membership is $160 for in-person classes, which are offered on Tuesdays and Wednesdays. Previous classes have covered topics ranging from A Short History of Jazz to Stem Cell Research and the Future of Health Care. More information is available at [email or web address]  Return in about 1 year (around 01/09/2025).   Interim History date 01/10/2024   Caleb Reynolds is a 88 y.o. male here for treatment and evaluation of Memory Loss, accompanied by loved one.  Caleb Reynolds last visit was on 11/09/2022  Patient reports memory has been stable  and loved one agrees. Loved one states he has a routine with work that does help. Denies mood or behavior changes. He is driving. Patient taking rivastigmine  as directed with no reported side effects.   History of Present Illness Caleb Reynolds is an 88 year old male who presents for a neurology follow-up regarding memory concerns.  Cognitive function and memory - No significant changes in memory since previous visit - Uses a pill box for medication management and maintains a routine for refilling prescriptions - Family assists with financial management to reduce stress, which can affect memory - Feels supported at home with increased assistance from his brother  Neurological symptoms - No stroke-like symptoms, including changes in speech, balance, vision, severe headaches, slurred speech, or sudden numbness, tingling, or weakness on one side of the body - No facial droop, asymmetry, or falls - No new neurological symptoms or tremors  Activities of daily living and social engagement - Continues to participate in dancing with the [employer] and working at the radio station - Engages in social activities with friends, including dining out - No issues with driving, including no accidents or getting lost - Involved in grocery shopping and maintains a routine of eating out with friends for breakfast and lunch; typically has a sandwich or Ensure for dinner  Sleep - Sleeping well    Interim History date 11/09/2022   Caleb Reynolds is a 88 y.o. male here for treatment and evaluation of Memory Loss, accompanied by loved one.   Caleb Reynolds last visit was on 11/07/2021  Patient reports that things have been stable. Still some short term memory loss but it does not seem to be worse. Patient is able to  manage everything on his own but he does not have confidence so he just has his children help. No concerns for safety, cognition, comprehension, or driving (he passed DMV test). Taking  Exelon  1.5 mg two times a day. He does report some frequent urination mostly during the day but loved one does report he drinks a lot of water . He is keeping social (goes to [employer], etc.).  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 Patient is into [employer]    Disease Summary: (Aggregate of information from previous visits)   Caleb Reynolds is a 88 y.o. male  here for evaluation of Memory Loss  Late onset mild dementia: States patient is highly functional, and continues to work without difficulty. Patient continues to have difficulty with short-term memory, though notes mild improvement. Patient and family report emphasizing routine. Also note patient has gained weight (approximately 9 lbs). States he is not as active as he used to be, and is interested in physical therapy and/or an exercise program. Denies safety concerns - son states as far as his ability to reason, he is doing well. Son states patient is able to recognize phishing calls and discuss them with son. Reports some boredom due to inactivity, though he remains active in his community and at work. Patient denies anxiety, hallucinations, depression. Reports he is sleeping well. Son endorses an adequate support system through family and work. Per patient's son he noticed regression of improvement around the time of the anniversary of his wife's passing.   04/2021: He also has not noticed significant benefit or side effects with the increase in his donepezil. He currently lives alone with family very near by. He is working as a chartered loss adjuster. He is active in a dancing club and performs 30 minutes of exercise on a stationary bike 3-7 times per week. He works on lawyer. His son helps him to look after his finances. He has not had trouble with taking his medications regularly.  SLUMS 05/02/2021: 17/30   Medications: Donepezil (ineffective), Exelon    He has slight hunched forward posture while walking  with decreased right arm swing. Low suspicion for parkinsonism.   History of acute mental status change with increased confusion - based on exam patient's impairment is more with perception of time. Patient has a history of left temporal lobe infarct, ?residual symptoms of previous stroke. Cannot rule out other causes such as severely low hemoglobin based on symptoms of abdominal pain, black stool and changes in bowel habits - symptoms improved following hospitalization for GI hemorrhage - without recurrence of altered mentation/episodes of confusion: Patient states 11/13/19 patient had sudden onset of memory loss. He does not remember anything in the past month. Patient has not had any new visual changes. Patient's wife passed away last year ( around September 2020). She was cremated and family had planned to bury her ashes this weekend. Son was unsure if this was a trigger for patient's symptoms.   Family reported increased bowel movements, patient did report some black stool and abdominal pain. He has a history of GI bleed.   Routine EEG (electroencephalography) 02/16/2020 - This is an awake and sleep abnormal EEG due to presence of intermittent left temporal > bi temporal theta slowing was noted.   Discussed ordering prolonged EEG - will hold off at this time as there is low concern this is epileptic in nature + patient has had improvement in symptoms.    History of stroke: Patient went  to ER for dizziness, double vision, and ataxia.  This had been going on for over 48 hours before going to ER. These symptoms lasted a couple days.  States he still has some very mild dizziness.  Is now on aspirin  and plavix .   Was not on this prior to this stroke.  This is first known stroke.  States he had a spell several years ago he had LOC that was short lasting.  States that he feels ok, mild dizziness not interfering with daily activities.  He is working and walking about 2 miles about 4 days a week.  MRI brain  without contrast 05/22/2019 - 2 cm acute infarct within the periventricular left temporal lobe. Mild chronic small vessel ischemic changes within the cerebral white matter, progressed as compared MRI 06/24/2012. Several tiny chronic lacunar infarcts within the left cerebellar hemisphere were not present on this prior exam. Stable, mild generalized parenchymal atrophy. Mild ethmoid sinus mucosal thickening. Trace fluid within bilateral mastoid air cells.   MRA Head without contrast 05/22/2019 - intracranial atherosclerotic disease with multifocal stenoses, most notably as follows. Apparent severe focal stenosis within a proximal M2 right MCA branch vessel. Moderate to moderately severe focal stenosis within the mid P2 left posterior cerebral artery. Sites of up to moderate stenosis within the proximal P2 right PCA.   MRA neck with and without contrast 05/22/2019 - The bilateral common and internal carotid arteries are patent within the neck without significant stenosis. The vertebral arteries are patent within the neck bilaterally with antegrade flow. There is limited assessment for stenoses within the non dominant cervical right vertebral artery due to developmental small vessel size. There are apparent moderate stenoses within the V1 and V2 segments of the dominant cervical left vertebral artery on the post-contrast imaging. However, the stenoses may be accentuated by motion artifact.    Medications: Aspirin , Crestor   Physical Exam   Vitals Vitals:   01/10/24 1303  BP: (!) 160/78  Pulse: 90  SpO2: 96%  Weight: 68.3 kg (150 lb 9.6 oz)  Height: 180.3 cm (5' 11)  PainSc: 0-No pain      Body mass index is 21 kg/m.  (Some of the exam changes noted are from previous clinical observations)  General Exam Patient was laying down for his appointment due to dizziness (11/2019) Carotid bruit  Heart murmur   Neurological Exam Date recall (11/17/2019) Incorrect month (patient said November)   Correctly recalled home address, city and zip code  Correctly recalled phone number  Correctly recalled number of children  Correctly recalled number of grandchildren   11/01/2020 Correct - President, date Caleb Reynolds was asked to count months of the year backward - Patient was able to do it but took long time and hesitated.  05/02/2021 Slight hunched forward posture Slight decreased arm swing on the right  Medications: Current Outpatient Medications on File Prior to Visit  Medication Sig Dispense Refill   amLODIPine (NORVASC) 10 MG tablet Take 10 mg by mouth.     aspirin  81 MG EC tablet Take by mouth     multivitamin tablet Take 1 tablet by mouth once daily     rosuvastatin  (CRESTOR ) 20 MG tablet Take 20 mg by mouth once daily     No current facility-administered medications on file prior to visit.   Past Medical History:  Past Medical History:  Diagnosis Date   History of stomach ulcers    History of stroke    Hyperlipidemia    Hypertension  Kidney stones     Past Surgical History:  Past Surgical History:  Procedure Laterality Date   COLONOSCOPY  03/11/2016   Adenomatous Polyp: No repeat due to age per RTE (dw)   EGD  03/11/2016   Gastritis: No repeat per RTE   ECSW     REPAIR INGUINAL HERNIA     Family History:  Family History  Problem Relation Name Age of Onset   High blood pressure (Hypertension) Mother     High blood pressure (Hypertension) Father     Coronary Artery Disease (Blocked arteries around heart) Father     Myocardial Infarction (Heart attack) Father     Colon polyps Neg Hx     Colon cancer Neg Hx     Social History:  Social History   Socioeconomic History   Marital status: Widowed   Number of children: 2   Highest education level: 12th grade  Tobacco Use   Smoking status: Former    Passive exposure: Past   Smokeless tobacco: Never  Vaping Use   Vaping status: Never Used  Substance and Sexual Activity    Alcohol use: No   Drug use: No   Sexual activity: Defer   Social Drivers of Corporate Investment Banker Strain: Low Risk  (12/20/2023)   Overall Financial Resource Strain (CARDIA)    Difficulty of Paying Living Expenses: Not hard at all  Food Insecurity: No Food Insecurity (12/20/2023)   Hunger Vital Sign    Worried About Running Out of Food in the Last Year: Never true    Ran Out of Food in the Last Year: Never true  Transportation Needs: No Transportation Needs (12/20/2023)   PRAPARE - Administrator, Civil Service (Medical): No    Lack of Transportation (Non-Medical): No  Housing Stability: Unknown (12/22/2023)   Housing Stability Vital Sign    Unable to Pay for Housing in the Last Year: No    Homeless in the Last Year: No   Allergies: No Known Allergies  This note has been created using automated tools and reviewed for accuracy by KAITLIN CAFFARO.

## 2024-03-07 ENCOUNTER — Inpatient Hospital Stay

## 2024-03-07 ENCOUNTER — Other Ambulatory Visit: Payer: Self-pay

## 2024-03-07 ENCOUNTER — Emergency Department: Admit: 2024-03-07 | Discharge: 2024-03-07 | Disposition: A | Attending: Internal Medicine | Admitting: Internal Medicine

## 2024-03-07 ENCOUNTER — Emergency Department

## 2024-03-07 ENCOUNTER — Inpatient Hospital Stay
Admission: EM | Admit: 2024-03-07 | Discharge: 2024-03-13 | Disposition: A | Attending: Emergency Medicine | Admitting: Emergency Medicine

## 2024-03-07 DIAGNOSIS — R7989 Other specified abnormal findings of blood chemistry: Secondary | ICD-10-CM

## 2024-03-07 DIAGNOSIS — J9601 Acute respiratory failure with hypoxia: Secondary | ICD-10-CM

## 2024-03-07 DIAGNOSIS — I447 Left bundle-branch block, unspecified: Secondary | ICD-10-CM

## 2024-03-07 DIAGNOSIS — I214 Non-ST elevation (NSTEMI) myocardial infarction: Secondary | ICD-10-CM | POA: Insufficient documentation

## 2024-03-07 DIAGNOSIS — I420 Dilated cardiomyopathy: Secondary | ICD-10-CM

## 2024-03-07 DIAGNOSIS — J189 Pneumonia, unspecified organism: Secondary | ICD-10-CM

## 2024-03-07 DIAGNOSIS — J810 Acute pulmonary edema: Principal | ICD-10-CM

## 2024-03-07 DIAGNOSIS — I5021 Acute systolic (congestive) heart failure: Secondary | ICD-10-CM | POA: Insufficient documentation

## 2024-03-07 DIAGNOSIS — I10 Essential (primary) hypertension: Secondary | ICD-10-CM | POA: Diagnosis present

## 2024-03-07 DIAGNOSIS — R0603 Acute respiratory distress: Secondary | ICD-10-CM

## 2024-03-07 DIAGNOSIS — J811 Chronic pulmonary edema: Secondary | ICD-10-CM | POA: Insufficient documentation

## 2024-03-07 LAB — COMPREHENSIVE METABOLIC PANEL WITH GFR
ALT: 10 U/L (ref 0–44)
AST: 24 U/L (ref 15–41)
Albumin: 3.9 g/dL (ref 3.5–5.0)
Alkaline Phosphatase: 33 U/L — ABNORMAL LOW (ref 38–126)
Anion gap: 19 — ABNORMAL HIGH (ref 5–15)
BUN: 19 mg/dL (ref 8–23)
CO2: 18 mmol/L — ABNORMAL LOW (ref 22–32)
Calcium: 9.8 mg/dL (ref 8.9–10.3)
Chloride: 102 mmol/L (ref 98–111)
Creatinine, Ser: 1.2 mg/dL (ref 0.61–1.24)
GFR, Estimated: 59 mL/min — ABNORMAL LOW
Glucose, Bld: 190 mg/dL — ABNORMAL HIGH (ref 70–99)
Potassium: 4.3 mmol/L (ref 3.5–5.1)
Sodium: 139 mmol/L (ref 135–145)
Total Bilirubin: 2.2 mg/dL — ABNORMAL HIGH (ref 0.0–1.2)
Total Protein: 7.1 g/dL (ref 6.5–8.1)

## 2024-03-07 LAB — URINALYSIS, W/ REFLEX TO CULTURE (INFECTION SUSPECTED)
Bacteria, UA: NONE SEEN
Bilirubin Urine: NEGATIVE
Glucose, UA: NEGATIVE mg/dL
Hgb urine dipstick: NEGATIVE
Ketones, ur: 5 mg/dL — AB
Nitrite: NEGATIVE
Protein, ur: NEGATIVE mg/dL
Specific Gravity, Urine: 1.012 (ref 1.005–1.030)
Squamous Epithelial / HPF: 0 /HPF (ref 0–5)
pH: 5 (ref 5.0–8.0)

## 2024-03-07 LAB — BLOOD GAS, VENOUS
Acid-base deficit: 2.4 mmol/L — ABNORMAL HIGH (ref 0.0–2.0)
Bicarbonate: 21.6 mmol/L (ref 20.0–28.0)
O2 Saturation: 77.8 %
Patient temperature: 37
pCO2, Ven: 34 mmHg — ABNORMAL LOW (ref 44–60)
pH, Ven: 7.41 (ref 7.25–7.43)
pO2, Ven: 46 mmHg — ABNORMAL HIGH (ref 32–45)

## 2024-03-07 LAB — CBC WITH DIFFERENTIAL/PLATELET
Abs Immature Granulocytes: 0.07 10*3/uL (ref 0.00–0.07)
Basophils Absolute: 0.1 10*3/uL (ref 0.0–0.1)
Basophils Relative: 1 %
Eosinophils Absolute: 0 10*3/uL (ref 0.0–0.5)
Eosinophils Relative: 0 %
HCT: 49.9 % (ref 39.0–52.0)
Hemoglobin: 16.5 g/dL (ref 13.0–17.0)
Immature Granulocytes: 1 %
Lymphocytes Relative: 3 %
Lymphs Abs: 0.3 10*3/uL — ABNORMAL LOW (ref 0.7–4.0)
MCH: 31 pg (ref 26.0–34.0)
MCHC: 33.1 g/dL (ref 30.0–36.0)
MCV: 93.8 fL (ref 80.0–100.0)
Monocytes Absolute: 0.6 10*3/uL (ref 0.1–1.0)
Monocytes Relative: 5 %
Neutro Abs: 11.5 10*3/uL — ABNORMAL HIGH (ref 1.7–7.7)
Neutrophils Relative %: 90 %
Platelets: 271 10*3/uL (ref 150–400)
RBC: 5.32 MIL/uL (ref 4.22–5.81)
RDW: 12.8 % (ref 11.5–15.5)
WBC: 12.6 10*3/uL — ABNORMAL HIGH (ref 4.0–10.5)
nRBC: 0 % (ref 0.0–0.2)

## 2024-03-07 LAB — RESP PANEL BY RT-PCR (RSV, FLU A&B, COVID)  RVPGX2
Influenza A by PCR: POSITIVE — AB
Influenza B by PCR: NEGATIVE
Resp Syncytial Virus by PCR: NEGATIVE
SARS Coronavirus 2 by RT PCR: NEGATIVE

## 2024-03-07 LAB — HEMOGLOBIN A1C
Hgb A1c MFr Bld: 6.1 % — ABNORMAL HIGH (ref 4.8–5.6)
Mean Plasma Glucose: 128.37 mg/dL

## 2024-03-07 LAB — TROPONIN T, HIGH SENSITIVITY
Troponin T High Sensitivity: 91 ng/L — ABNORMAL HIGH (ref 0–19)
Troponin T High Sensitivity: 113 ng/L (ref 0–19)

## 2024-03-07 LAB — MAGNESIUM: Magnesium: 2.2 mg/dL (ref 1.7–2.4)

## 2024-03-07 LAB — PRO BRAIN NATRIURETIC PEPTIDE: Pro Brain Natriuretic Peptide: 2259 pg/mL — ABNORMAL HIGH

## 2024-03-07 LAB — ECHOCARDIOGRAM COMPLETE
AR max vel: 1.67 cm2
AV Area VTI: 1.58 cm2
AV Area mean vel: 1.71 cm2
AV Mean grad: 3 mmHg
AV Peak grad: 6.4 mmHg
Ao pk vel: 1.26 m/s
Calc EF: 36.1 %
MV M vel: 4.62 m/s
MV Peak grad: 85.4 mmHg
Radius: 0.6 cm
S' Lateral: 4.3 cm
Single Plane A2C EF: 38.2 %
Single Plane A4C EF: 34.1 %
Weight: 2630.4 [oz_av]

## 2024-03-07 LAB — LIPASE, BLOOD: Lipase: 24 U/L (ref 11–51)

## 2024-03-07 LAB — LACTIC ACID, PLASMA
Lactic Acid, Venous: 3.1 mmol/L (ref 0.5–1.9)
Lactic Acid, Venous: 1.7 mmol/L (ref 0.5–1.9)

## 2024-03-07 LAB — PROTIME-INR
INR: 1.1 (ref 0.8–1.2)
Prothrombin Time: 14.4 s (ref 11.4–15.2)

## 2024-03-07 LAB — APTT: aPTT: 33 s (ref 24–36)

## 2024-03-07 LAB — TSH: TSH: 1.46 u[IU]/mL (ref 0.350–4.500)

## 2024-03-07 LAB — T4, FREE: Free T4: 1.22 ng/dL (ref 0.80–2.00)

## 2024-03-07 MED ORDER — ONDANSETRON HCL 4 MG/2ML IJ SOLN: 4.0000 mg | Freq: Four times a day (QID) | INTRAMUSCULAR | Status: AC | PRN

## 2024-03-07 MED ORDER — SODIUM CHLORIDE 0.9 % IV BOLUS
250.0000 mL | Freq: Once | INTRAVENOUS | Status: AC
  Administered 2024-03-07: 250 mL via INTRAVENOUS

## 2024-03-07 MED ORDER — ENOXAPARIN SODIUM 40 MG/0.4ML IJ SOSY
40.0000 mg | PREFILLED_SYRINGE | INTRAMUSCULAR | Status: AC
  Administered 2024-03-07 – 2024-03-12 (×6): 40 mg via SUBCUTANEOUS
  Filled 2024-03-07 (×6): qty 0.4

## 2024-03-07 MED ORDER — RIVASTIGMINE TARTRATE 1.5 MG PO CAPS
1.5000 mg | ORAL_CAPSULE | Freq: Two times a day (BID) | ORAL | Status: DC
  Administered 2024-03-07 – 2024-03-13 (×12): 1.5 mg via ORAL
  Filled 2024-03-07 (×13): qty 1

## 2024-03-07 MED ORDER — ONDANSETRON HCL 4 MG PO TABS: 4.0000 mg | ORAL_TABLET | Freq: Four times a day (QID) | ORAL | Status: DC | PRN

## 2024-03-07 MED ORDER — FUROSEMIDE 10 MG/ML IJ SOLN
20.0000 mg | Freq: Once | INTRAMUSCULAR | Status: AC
  Administered 2024-03-07: 20 mg via INTRAVENOUS
  Filled 2024-03-07: qty 4

## 2024-03-07 MED ORDER — ACETAMINOPHEN 325 MG PO TABS
650.0000 mg | ORAL_TABLET | Freq: Four times a day (QID) | ORAL | Status: DC | PRN
  Administered 2024-03-08: 650 mg via ORAL
  Filled 2024-03-07: qty 2

## 2024-03-07 MED ORDER — ACETAMINOPHEN 650 MG RE SUPP: 650.0000 mg | Freq: Four times a day (QID) | RECTAL | Status: DC | PRN

## 2024-03-07 MED ORDER — SODIUM CHLORIDE 0.9 % IV SOLN: 1.0000 g | INTRAVENOUS | Status: DC

## 2024-03-07 MED ORDER — FUROSEMIDE 10 MG/ML IJ SOLN
40.0000 mg | Freq: Two times a day (BID) | INTRAMUSCULAR | Status: DC
  Administered 2024-03-07 – 2024-03-09 (×4): 40 mg via INTRAVENOUS
  Filled 2024-03-07 (×4): qty 4

## 2024-03-07 MED ORDER — IOHEXOL 350 MG/ML SOLN
100.0000 mL | Freq: Once | INTRAVENOUS | Status: AC | PRN
  Administered 2024-03-07: 100 mL via INTRAVENOUS

## 2024-03-07 MED ORDER — SODIUM CHLORIDE 0.9 % IV SOLN
100.0000 mg | Freq: Once | INTRAVENOUS | Status: AC
  Administered 2024-03-07: 100 mg via INTRAVENOUS
  Filled 2024-03-07: qty 100

## 2024-03-07 MED ORDER — ROSUVASTATIN CALCIUM 10 MG PO TABS
20.0000 mg | ORAL_TABLET | Freq: Every day | ORAL | Status: DC
  Administered 2024-03-07 – 2024-03-12 (×6): 20 mg via ORAL
  Filled 2024-03-07: qty 2
  Filled 2024-03-07: qty 1
  Filled 2024-03-07 (×3): qty 2
  Filled 2024-03-07: qty 1
  Filled 2024-03-07: qty 2

## 2024-03-07 MED ORDER — ASPIRIN 81 MG PO TBEC
81.0000 mg | DELAYED_RELEASE_TABLET | Freq: Every day | ORAL | Status: AC
  Administered 2024-03-07 – 2024-03-13 (×6): 81 mg via ORAL
  Filled 2024-03-07 (×7): qty 1

## 2024-03-07 MED ORDER — SODIUM CHLORIDE 0.9 % IV SOLN
2.0000 g | Freq: Once | INTRAVENOUS | Status: AC
  Administered 2024-03-07: 2 g via INTRAVENOUS
  Filled 2024-03-07: qty 20

## 2024-03-07 MED ORDER — PANTOPRAZOLE SODIUM 40 MG IV SOLR
40.0000 mg | INTRAVENOUS | Status: DC
  Administered 2024-03-07: 40 mg via INTRAVENOUS
  Filled 2024-03-07: qty 10

## 2024-03-07 MED ORDER — SODIUM CHLORIDE 0.9 % IV SOLN
100.0000 mg | Freq: Two times a day (BID) | INTRAVENOUS | Status: DC
  Administered 2024-03-07: 100 mg via INTRAVENOUS
  Filled 2024-03-07 (×2): qty 100

## 2024-03-07 NOTE — ED Notes (Signed)
 Pt transitioned off bipap and onto 6L [place]

## 2024-03-07 NOTE — ED Provider Notes (Signed)
 "  [facility] Provider Note    Event Date/Time   First MD Initiated Contact with Patient 03/07/24 1011     (approximate)   History   Respiratory Distress   HPI  Caleb Reynolds is a 89 y.o. male with a past medical history of hypertension hyperlipidemia mild Alzheimer dementia but lives independently and works as a disc jockey, previous GI bleeding secondary to NSAID use who presents to the emergency department in respiratory distress.  Patient has been feeling ill with shortness of breath and cough for the past 3 days.  His shortness of breath acutely worsened today.  Upon EMS arrival he was profoundly hypoxic and when oxygen via nonrebreather did not improve his oxygen level greater than 88% he was placed on CPAP.  He denied any chest pain but EMS was concerned about a new left bundle branch block which the patient reports he has no prior history of such.  He denies any abdominal pain changes in urinary or bowel habits     Physical Exam   Triage Vital Signs: ED Triage Vitals  Encounter Vitals Group     BP      Girls Systolic BP Percentile      Girls Diastolic BP Percentile      Boys Systolic BP Percentile      Boys Diastolic BP Percentile      Pulse      Resp      Temp      Temp src      SpO2      Weight      Height      Head Circumference      Peak Flow      Pain Score      Pain Loc      Pain Education      Exclude from Growth Chart     Most recent vital signs: Vitals:   03/07/24 1230 03/07/24 1537  BP: 116/72 118/77  Pulse: (!) 109 (!) 118  Resp: (!) 28 (!) 34  Temp:    SpO2: 100% 100%    Nursing Triage Note reviewed. Vital signs reviewed and patient is hypoxic  General: Patient is well nourished, well developed, awake and alert, appears unwell on CPAP Head: Normocephalic and atraumatic Eyes: Normal inspection, extraocular muscles intact, no conjunctival pallor Ear, nose, throat: Normal external exam Neck: Normal range of  motion Respiratory: Patient is in moderate respiratory distress, lungs with mild rales and rhonchi Cardiovascular: Patient is tachycardic, RR GI: Abd SNT with no guarding or rebound  Back: Normal inspection of the back with good strength and range of motion throughout all ext Extremities: pulses intact with good cap refills, no LE pitting edema or calf tenderness Neuro: The patient is alert and oriented to person, place, and time, appropriately conversive, with 5/5 bilat UE/LE strength, no gross motor or sensory defects noted. Coordination appears to be adequate. Skin: Warm, dry, and intact Psych: normal mood and affect, no SI or HI  ED Results / Procedures / Treatments   Labs (all labs ordered are listed, but only abnormal results are displayed) Labs Reviewed  LACTIC ACID, PLASMA - Abnormal; Notable for the following components:      Result Value   Lactic Acid, Venous 3.1 (*)    All other components within normal limits  COMPREHENSIVE METABOLIC PANEL WITH GFR - Abnormal; Notable for the following components:   CO2 18 (*)    Glucose, Bld 190 (*)  Alkaline Phosphatase 33 (*)    Total Bilirubin 2.2 (*)    GFR, Estimated 59 (*)    Anion gap 19 (*)    All other components within normal limits  CBC WITH DIFFERENTIAL/PLATELET - Abnormal; Notable for the following components:   WBC 12.6 (*)    Neutro Abs 11.5 (*)    Lymphs Abs 0.3 (*)    All other components within normal limits  PRO BRAIN NATRIURETIC PEPTIDE - Abnormal; Notable for the following components:   Pro Brain Natriuretic Peptide 2,259.0 (*)    All other components within normal limits  BLOOD GAS, VENOUS - Abnormal; Notable for the following components:   pCO2, Ven 34 (*)    pO2, Ven 46 (*)    Acid-base deficit 2.4 (*)    All other components within normal limits  URINALYSIS, W/ REFLEX TO CULTURE (INFECTION SUSPECTED) - Abnormal; Notable for the following components:   Color, Urine YELLOW (*)    APPearance HAZY (*)     Ketones, ur 5 (*)    Leukocytes,Ua TRACE (*)    All other components within normal limits  TROPONIN T, HIGH SENSITIVITY - Abnormal; Notable for the following components:   Troponin T High Sensitivity 91 (*)    All other components within normal limits  TROPONIN T, HIGH SENSITIVITY - Abnormal; Notable for the following components:   Troponin T High Sensitivity 113 (*)    All other components within normal limits  RESP PANEL BY RT-PCR (RSV, FLU A&B, COVID)  RVPGX2  CULTURE, BLOOD (ROUTINE X 2)  CULTURE, BLOOD (ROUTINE X 2)  URINE CULTURE  LACTIC ACID, PLASMA  PROTIME-INR  LIPASE, BLOOD  APTT  MAGNESIUM  TSH  T4, FREE  HEMOGLOBIN A1C     EKG EKG and rhythm strip are interpreted by myself:   EKG: Wide rhythm] at heart rate of 132, normal QRS duration, QTc 592, nonspecific ST segments and T waves no ectopy EKG not consistent with Acute STEMI Rhythm strip: Wide tachycardic rhythm in lead II   RADIOLOGY Chest x-ray: Consistent with pneumonia and pleural edema my independent review interpretation radiologist agrees CTA chest: Pending   PROCEDURES:  Critical Care performed: Yes, see critical care procedure note(s)  .Critical Care  Performed by: Nicholaus Rolland BRAVO, MD Authorized by: Nicholaus Rolland BRAVO, MD   Critical care provider statement:    Critical care time (minutes):  45   Critical care was necessary to treat or prevent imminent or life-threatening deterioration of the following conditions:  Respiratory failure   Critical care was time spent personally by me on the following activities:  Development of treatment plan with patient or surrogate, discussions with consultants, evaluation of patient's response to treatment, examination of patient, ordering and review of laboratory studies, ordering and review of radiographic studies, ordering and performing treatments and interventions, pulse oximetry, re-evaluation of patient's condition and review of old charts Comments:      Need for BiPAP and management    MEDICATIONS ORDERED IN ED: Medications  acetaminophen  (TYLENOL ) tablet 650 mg (has no administration in time range)    Or  acetaminophen  (TYLENOL ) suppository 650 mg (has no administration in time range)  ondansetron  (ZOFRAN ) tablet 4 mg (has no administration in time range)    Or  ondansetron  (ZOFRAN ) injection 4 mg (has no administration in time range)  cefTRIAXone  (ROCEPHIN ) 2 g in sodium chloride  0.9 % 100 mL IVPB (0 g Intravenous Stopped 03/07/24 1118)  doxycycline  (VIBRAMYCIN ) 100 mg in sodium chloride  0.9 %  250 mL IVPB (0 mg Intravenous Stopped 03/07/24 1406)  sodium chloride  0.9 % bolus 250 mL (0 mLs Intravenous Stopped 03/07/24 1119)  furosemide  (LASIX ) injection 20 mg (20 mg Intravenous Given 03/07/24 1248)     IMPRESSION / MDM / ASSESSMENT AND PLAN / ED COURSE                                Differential diagnosis includes, but is not limited to: Pneumonia, flash pulm edema, CHF, atypical ACS, anemia, electrolyte derangement, PE  ED course: Patient presents acutely on BiPAP but is mentating well and appears well-perfused.  EKG demonstrates an LBBB of unclear chronicity as his last EKG in our system was over 3 years ago.  I am reassured he is not endorsing any chest pain.  Chest x-ray was concerning for possible pneumonia and he did receive broad-spectrum antibiotics.  Troponin and BNP elevated.  His case was discussed with on-call cardiology team as his rhythm was unclear based on the EKG.  Dr. Mady believes that this is likely sinus tachycardia we will hold off on diltiazem at this time.  He did do a POCUS at bedside and reports that the patient's ejection fraction is likely newly 20%.  His formal consultation note is pending.  He agrees with admission today.  Case discussed with hospitalist   Clinical Course as of 03/07/24 1555  Tue Mar 07, 2024  1026 Family arrived and I confirmed the history.  Patient continues to deny chest pain.  Chest x-ray  on my independent review interpretation does demonstrate a large opacity in the right consistent with pneumonia.  Antibiotics initiated [HD]  1027 Will initiate a small bolus normal given concern for fluid overload [HD]  1102 Blood gas, venous(!) No acidosis [HD]  1108 I will reach out to nonaffiliated cardiology [HD]  1112 Dr. Mady is scrubbed in.  He will call back afterwards, MRN given [HD]  1141 Dr. Mady at bedside.  [HD]  1256 Case discussed with hospitalist for admission [HD]    Clinical Course User Index [HD] Nicholaus Rolland BRAVO, MD   -- Risk: 5 This patient has a high risk of morbidity due to further diagnostic testing or treatment. Rationale: This patients evaluation and management involve a high risk of morbidity due to the potential severity of presenting symptoms, need for diagnostic testing, and/or initiation of treatment that may require close monitoring. The differential includes conditions with potential for significant deterioration or requiring escalation of care. Treatment decisions in the ED, including medication administration, procedural interventions, or disposition planning, reflect this level of risk. COPA: 5 The patient has the following acute or chronic illness/injury that poses a possible threat to life or bodily function: [X] : The patient has a potentially serious acute condition or an acute exacerbation of a chronic illness requiring urgent evaluation and management in the Emergency Department. The clinical presentation necessitates immediate consideration of life-threatening or function-threatening diagnoses, even if they are ultimately ruled out.   FINAL CLINICAL IMPRESSION(S) / ED DIAGNOSES   Final diagnoses:  Flash pulmonary edema (HCC)  Pneumonia due to infectious organism, unspecified laterality, unspecified part of lung  Respiratory distress  Left bundle branch block  Reduced ejection fraction concurrent with and due to acute heart failure (HCC)     Rx /  DC Orders   ED Discharge Orders     None        Note:  This document was prepared  using Conservation officer, historic buildings and may include unintentional dictation errors.   Nicholaus Rolland BRAVO, MD 03/07/24 1555  "

## 2024-03-07 NOTE — ED Triage Notes (Signed)
 Pt from home via [facility]. Has had congestion and dry cough for last 3 days and declined in respiratory function No hx of COPD or asthma.  250 cc fluid from EMS EMS vitals: BP 134/73 130 HR LBB Endtidal 20 RR 48 87% RA -- non-rebreather at 95% -- during transport desat and placed on CPAP at 88% CBG 119

## 2024-03-07 NOTE — Consult Note (Signed)
 "  Cardiology Consultation   Patient ID: DIOGO ANNE MRN: [id number]; DOB: 02-Jul-1935  Admit date: 03/07/2024 Date of Consult: 03/07/2024  PCP:  Alla Amis, MD   [facility] Providers Cardiologist:  New        Patient Profile: Caleb Reynolds is a 89 y.o. male with a hx of hypertension, hyperlipidemia, stroke, peptic ulcer disease with at least 2 significant GI bleeds, kidney stones, and Alzheimer's disease, who is being seen 03/07/2024 for the evaluation of abnormal EKG and shortness of breath at the request of Dr. Nicholaus.  History of Present Illness: Mr. Corvin reports that he was in his usual state of health until about 3 to 4 days ago.  He has a hard time describing specific symptoms on account of short-term memory loss, though his family notes that he seemed more weak and short of breath.  This became quite profound this morning, with accompanying cough.  He presented to the ED via EMS and was found to be tachycardic with a heart rate of 130, respiratory rate of 48, and an oxygen saturation of 87% on room air.  He denies having any chest pain or palpitations.  He also denies any history of underlying heart disease.  He had not had any recent edema, orthopnea, or PND.  Workup in the ER was notable for sinus tachycardia with left bundle branch block on his EKG, which is new from prior tracings in our system.  Chest x-ray also showed bilateral patchy airspace opacities, right greater than left, with diffuse interstitial prominence that may reflect atypical pulmonary edema and/or multifocal infection.  Past Medical History:  Diagnosis Date   Chronic kidney disease    kidney stones   Hypertension     Past Surgical History:  Procedure Laterality Date   COLONOSCOPY WITH PROPOFOL  N/A 03/11/2016   Procedure: COLONOSCOPY WITH PROPOFOL ;  Surgeon: Lamar ONEIDA Holmes, MD;  Location: [facility] ENDOSCOPY;  Service: Endoscopy;  Laterality: N/A;   ESOPHAGOGASTRODUODENOSCOPY (EGD)  WITH PROPOFOL  N/A 03/11/2016   Procedure: ESOPHAGOGASTRODUODENOSCOPY (EGD) WITH PROPOFOL ;  Surgeon: Lamar ONEIDA Holmes, MD;  Location: [facility] ENDOSCOPY;  Service: Endoscopy;  Laterality: N/A;      Scheduled Meds:  Continuous Infusions:  doxycycline  (VIBRAMYCIN ) IV 100 mg (03/07/24 1119)   PRN Meds:   Allergies:   Allergies[1]  Social History:   Unable to obtain due to critical illness   Family History:   Unable to obtain due to critical illness.  ROS:  Unable to obtain due to critical illness.  Physical Exam/Data: Vitals:   03/07/24 1100 03/07/24 1130 03/07/24 1200 03/07/24 1230  BP: 119/75 114/69 125/79 116/72  Pulse: (!) 114 (!) 111 (!) 114 (!) 109  Resp: (!) 31 (!) 27 (!) 29 (!) 28  Temp:      TempSrc:      SpO2: 100% 100% 100% 100%  Weight:       No intake or output data in the 24 hours ending 03/07/24 1301    03/07/2024   10:12 AM 05/22/2019   12:13 PM 03/11/2016    9:22 AM  Last 3 Weights  Weight (lbs) 164 lb 6.4 oz 150 lb 155 lb  Weight (kg) 74.571 kg 68.04 kg 70.308 kg     Body mass index is 22.93 kg/m.  General: Elderly man lying in bed wearing BiPAP.  His son and daughter-in-law are at the bedside. HEENT: BiPAP mask in place. Neck: Unable to assess JVP due to accessory muscle use and BiPAP mask straps. Vascular:  2+ radial pulses bilaterally. Cardiac: Tachycardic but regular without obvious murmurs, though prominent breath sounds limited evaluation. Lungs: Diminished breath sounds throughout.  No obvious crackles appreciated. Abd: soft, nontender, no hepatomegaly  Ext: no edema Musculoskeletal:  No deformities, BUE and BLE strength normal and equal Skin: warm and dry  Neuro:  CNs 2-12 intact, no focal abnormalities noted Psych:  Normal affect   EKG:  The EKG was personally reviewed and demonstrates: Sinus tachycardia with left bundle branch block. Telemetry:  Telemetry was personally reviewed and demonstrates: Sinus tachycardia with bundle branch  block.  Relevant CV Studies: Limited bedside echocardiogram was personally performed.  LVEF appears severely reduced with global hypokinesis.  RV size and function are grossly normal.  No significant pericardial effusion identified.  Laboratory Data: High Sensitivity Troponin:  No results for input(s): TROPONINIHS in the last 720 hours.  Recent Labs  Lab 03/07/24 1019  TRNPT 91*      Chemistry Recent Labs  Lab 03/07/24 1019  NA 139  K 4.3  CL 102  CO2 18*  GLUCOSE 190*  BUN 19  CREATININE 1.20  CALCIUM  9.8  MG 2.2  GFRNONAA 59*  ANIONGAP 19*    Recent Labs  Lab 03/07/24 1019  PROT 7.1  ALBUMIN 3.9  AST 24  ALT 10  ALKPHOS 33*  BILITOT 2.2*   Lipids No results for input(s): CHOL, TRIG, HDL, LABVLDL, LDLCALC, CHOLHDL in the last 168 hours.  Hematology Recent Labs  Lab 03/07/24 1019  WBC 12.6*  RBC 5.32  HGB 16.5  HCT 49.9  MCV 93.8  MCH 31.0  MCHC 33.1  RDW 12.8  PLT 271   Thyroid  Recent Labs  Lab 03/07/24 1019  TSH 1.460  FREET4 1.22    BNP Recent Labs  Lab 03/07/24 1019  PROBNP 2,259.0*    DDimer No results for input(s): DDIMER in the last 168 hours.  Radiology/Studies:  DG Chest Portable 1 View Result Date: 03/07/2024 EXAM: 1 VIEW(S) XRAY OF THE CHEST 03/07/2024 10:26:00 AM COMPARISON: Comparison 06/24/2012. CLINICAL HISTORY: Respiratory distress. FINDINGS: LUNGS AND PLEURA: New Bilateral patchy airspace opacities, right worse than left. Diffuse interstitial prominence. No pleural effusion. No pneumothorax. HEART AND MEDIASTINUM: Aortic calcification. No acute abnormality of the cardiac and mediastinal silhouettes. BONES AND SOFT TISSUES: No acute osseous abnormality. IMPRESSION: 1. Bilateral patchy airspace opacities, right worse than left, with diffuse interstitial prominence, which may reflect atypical pulmonary edema and/or multifocal infection. Electronically signed by: Dayne Hassell MD 03/07/2024 10:31 AM EST RP  Workstation: [id number]    Assessment and Plan: Acute HFrEF: Mr. Burley presents with some malaise and increasing shortness of breath over the last 3 to 4 days, very pronounced earlier today.  Based on the severely reduced LVEF on my limited echocardiogram suggest that he is having acute systolic heart failure.  His LBBB could indicate underlying ischemic heart disease, though I do not believe it reflects an acute MI given that Mr. Olazabal has not had any chest pain and his troponin trend is relatively flat and only mildly elevated most suggestive of supply-demand mismatch.  I recommend diuresis.  We will follow-up his formal echocardiogram and interpretation of his CTA chest, which shows moderate bilateral pleural effusions and bilateral parenchymal lung opacities most suspicious for pulmonary edema.  I do not recommend aggressive heart rate control, as tachycardia is likely necessary to maintain cardiac output in the setting of low stroke-volume.  He received furosemide  20 mg IV x 1 earlier today.  I will redose  furosemide  40 mg IV twice daily with close monitoring of I's/O's as Mr. Rajewski does not appear significantly volume overloaded on physical exam.  His initial lactic acid level was mildly elevated but has normalized on repeat.  I do not think there is a need for inotropic therapy at this time, particularly given his underlying tachycardia which would be exacerbated by milrinone/dobutamine.  If blood pressure and renal function allow, addition of an ACE inhibitor/ARB should be considered as soon as tomorrow  Acute respiratory failure with hypoxia: Most likely due to acute HFrEF with pulmonary edema bilateral pleural effusions though concurrent multifocal pneumonia cannot be excluded.  We will diurese as above.  Defer role for antimicrobial therapy to the primary team.  Elevated troponin: Most likely reflect supply-demand mismatch in the setting of acute HFrEF.  Once the patient's volume status  and hypoxia have improved, we will need to consider right and left heart catheterization to exclude ischemic substrate as well as better understand his filling pressures.  Risk Assessment/Risk Scores:       [place]  Heart Association (NYHA) Functional Class NYHA Class IV   For questions or updates, please contact [facility] Please consult [email or web address] for contact info under [facility] Cardiology.  Signed, Lonni Hanson, MD  03/07/2024 1:01 PM     [1] No Known Allergies  "

## 2024-03-07 NOTE — Progress Notes (Signed)
 Pt taken off bipap and placed on 6lpm [place], sats 96%, respiratory rate 28/min. RN aware.

## 2024-03-07 NOTE — H&P (Addendum)
 " History and Physical    Patient: Caleb Reynolds FMW:[id number] DOB: [birth year] DOA: 03/07/2024 DOS: the patient was seen and examined on 03/07/2024 PCP: Alla Amis, MD  Patient coming from: Home  Chief Complaint:  Chief Complaint  Patient presents with   Respiratory Distress   HPI: Caleb Reynolds is a 89 y.o. male with medical history significant of HTN, HLD, Alzheimers dementia but lives independently, CVA without residual deficits, GI bleeding 2/2 NSAID induced PUD, presents to the ED for evaluaiton of sudden onset SOB that started this morning.  Patient is accompanied by his son and daughter-in-law.  History as per son due to patient's dementia. The patient called his son earlier this morning saying he did not feel well and was tired. His son went to his house and found him in respiratory distress and called EMS.  He said he was not pale or diaphoretic and did not complain of any chest pain.  Prior to this morning he was well and has not had any fevers, cough, lower extremity swelling, recent sick contacts.     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 No known personal cardiac history.   He was put on CPAP and brought into the ED, then escalated to bipap upon arrival here.   On my exam he was in no distress, breathing comfortably and saturating appropriately on bipap. he was afebrile, hemodynamically stable, although in persistent sinus tachycardia. EKG with LBBB, no prior for comparison.   He was  tachypneic with a white blood cell count of 12.6.  ABG was overall reassuring. Troponin elevated at 91, BNP over 2200.  Limited bedside echo performed by Cardiology suggestive of acute systolic heart failure. Chest x-ray with diffuse bilateral patchy opacities, worse on the right, concerning for atypical pulmonary edema or multifocal pneumonia.  He was given a dose of doxycycline  and ceftriaxone  in addition to 20 mg of IV Lasix . CTA  pending   Will admit to hospitlalist service for further evaluation/management  Review of Systems: Review of Systems  Constitutional:  Negative for chills, fever, malaise/fatigue and weight loss.  Respiratory:  Positive for shortness of breath. Negative for cough, sputum production and wheezing.   Cardiovascular:  Negative for chest pain, palpitations and leg swelling.  Gastrointestinal:  Negative for abdominal pain, constipation, diarrhea, heartburn, nausea and vomiting.  Genitourinary:  Negative for dysuria and frequency.  Musculoskeletal:  Negative for back pain, falls and neck pain.  Skin:  Negative for itching and rash.  Neurological:  Negative for dizziness, focal weakness, loss of consciousness and headaches.  Psychiatric/Behavioral:  Positive for memory loss.     Past Medical History:  Diagnosis Date   Chronic kidney disease    kidney stones   Hypertension    Past Surgical History:  Procedure Laterality Date   COLONOSCOPY WITH PROPOFOL  N/A 03/11/2016   Procedure: COLONOSCOPY WITH PROPOFOL ;  Surgeon: Lamar ONEIDA Holmes, MD;  Location: [facility] ENDOSCOPY;  Service: Endoscopy;  Laterality: N/A;   ESOPHAGOGASTRODUODENOSCOPY (EGD) WITH PROPOFOL  N/A 03/11/2016   Procedure: ESOPHAGOGASTRODUODENOSCOPY (EGD) WITH PROPOFOL ;  Surgeon: Lamar ONEIDA Holmes, MD;  Location: [facility] ENDOSCOPY;  Service: Endoscopy;  Laterality: N/A;   Social History:  reports that he has quit smoking. He has never used smokeless tobacco. He reports that he does not drink alcohol and does not use drugs.  Allergies[1]  No family history on file.  Prior to Admission medications  Medication Sig Start Date End Date Taking?  Authorizing Provider  acetaminophen  (TYLENOL ) 650 MG CR tablet Take 650 mg by mouth daily.    [provider]  amLODipine (NORVASC) 10 MG tablet Take 10 mg by mouth daily.    [provider]  aspirin  EC 81 MG EC tablet Take 1 tablet (81 mg total) by mouth daily. 05/25/19   Maree Hue,  MD  atorvastatin  (LIPITOR) 80 MG tablet Take 1 tablet (80 mg total) by mouth daily. 05/25/19   Maree Hue, MD  clopidogrel  (PLAVIX ) 75 MG tablet Take 1 tablet (75 mg total) by mouth daily. Patient not taking: Reported on 07/04/2019 05/25/19   Shah, Vipul, MD  loratadine (CLARITIN) 10 MG tablet Take 10 mg by mouth daily.    [provider]  pantoprazole  (PROTONIX ) 40 MG tablet Take 40 mg by mouth 2 (two) times daily before a meal.    [provider]  sucralfate  (CARAFATE ) 1 g tablet Take 1 g by mouth 4 (four) times daily.    [provider]    Physical Exam: Vitals:   03/07/24 1550 03/07/24 1600 03/07/24 1630 03/07/24 1700  BP:  118/74 124/77 123/73  Pulse:  (!) 113 (!) 120 (!) 115  Resp:  (!) 39 (!) 39 (!) 35  Temp:      TempSrc:      SpO2: 96%     Weight:       Physical Exam Vitals and nursing note reviewed.  Constitutional:      General: He is not in acute distress.    Appearance: He is normal weight. He is not ill-appearing or toxic-appearing.  HENT:     Head: Normocephalic and atraumatic.     Mouth/Throat:     Mouth: Mucous membranes are moist.  Eyes:     Extraocular Movements: Extraocular movements intact.     Pupils: Pupils are equal, round, and reactive to light.  Cardiovascular:     Rate and Rhythm: Regular rhythm. Tachycardia present.  Pulmonary:     Effort: Pulmonary effort is normal.     Breath sounds: Normal breath sounds.  Abdominal:     General: Abdomen is flat. Bowel sounds are normal. There is no distension.     Palpations: Abdomen is soft.     Tenderness: There is no abdominal tenderness.  Musculoskeletal:        General: No swelling or tenderness.     Cervical back: Normal range of motion and neck supple.     Right lower leg: No edema.     Left lower leg: No edema.  Skin:    General: Skin is warm and dry.     Capillary Refill: Capillary refill takes less than 2 seconds.  Neurological:     Mental Status: He is alert. Mental  status is at baseline.     Cranial Nerves: No cranial nerve deficit.     Motor: No weakness.  Psychiatric:        Mood and Affect: Mood normal.        Behavior: Behavior normal.     Data Reviewed:   Labs on Admission: I have personally reviewed following labs and imaging studies  CBC: Recent Labs  Lab 03/07/24 1019  WBC 12.6*  NEUTROABS 11.5*  HGB 16.5  HCT 49.9  MCV 93.8  PLT 271   Basic Metabolic Panel: Recent Labs  Lab 03/07/24 1019  NA 139  K 4.3  CL 102  CO2 18*  GLUCOSE 190*  BUN 19  CREATININE 1.20  CALCIUM  9.8  MG 2.2   GFR: CrCl cannot be calculated (Unknown ideal weight.). Liver Function Tests: Recent Labs  Lab 03/07/24 1019  AST 24  ALT 10  ALKPHOS 33*  BILITOT 2.2*  PROT 7.1  ALBUMIN 3.9   Recent Labs  Lab 03/07/24 1019  LIPASE 24   No results for input(s): AMMONIA in the last 168 hours. Coagulation Profile: Recent Labs  Lab 03/07/24 1019  INR 1.1   Cardiac Enzymes: No results for input(s): CKTOTAL, CKMB, CKMBINDEX, TROPONINI in the last 168 hours. BNP (last 3 results) Recent Labs    03/07/24 1019  PROBNP 2,259.0*   HbA1C: No results for input(s): HGBA1C in the last 72 hours. CBG: No results for input(s): GLUCAP in the last 168 hours. Lipid Profile: No results for input(s): CHOL, HDL, LDLCALC, TRIG, CHOLHDL, LDLDIRECT in the last 72 hours. Thyroid Function Tests: Recent Labs    03/07/24 1019  TSH 1.460  FREET4 1.22   Anemia Panel: No results for input(s): VITAMINB12, FOLATE, FERRITIN, TIBC, IRON, RETICCTPCT in the last 72 hours. Urine analysis:    Component Value Date/Time   COLORURINE YELLOW (A) 03/07/2024 1019   APPEARANCEUR HAZY (A) 03/07/2024 1019   APPEARANCEUR Clear 06/24/2012 1644   LABSPEC 1.012 03/07/2024 1019   LABSPEC 1.014 06/24/2012 1644   PHURINE 5.0 03/07/2024 1019   GLUCOSEU NEGATIVE 03/07/2024 1019   GLUCOSEU Negative 06/24/2012 1644   HGBUR NEGATIVE  03/07/2024 1019   BILIRUBINUR NEGATIVE 03/07/2024 1019   BILIRUBINUR Negative 06/24/2012 1644   KETONESUR 5 (A) 03/07/2024 1019   PROTEINUR NEGATIVE 03/07/2024 1019   NITRITE NEGATIVE 03/07/2024 1019   LEUKOCYTESUR TRACE (A) 03/07/2024 1019   LEUKOCYTESUR Negative 06/24/2012 1644    Radiological Exams on Admission: ECHOCARDIOGRAM COMPLETE Result Date: 03/07/2024    ECHOCARDIOGRAM REPORT   Patient Name:   Caleb Reynolds Date of Exam: 03/07/2024 Medical Rec #:  [id number]         Height:       71.0 in Accession #:    [id number]        Weight:       164.4 lb Date of Birth:  10-21-1935         BSA:          1.940 m Patient Age:    89 years          BP:           118/79 mmHg Patient Gender: M                 HR:           122 bpm. Exam Location:  [facility] Procedure: 2D Echo, Color Doppler and Cardiac Doppler (Both Spectral and Color            Flow Doppler were utilized during procedure). Indications:     Systolic Heart Failure  History:         Patient has prior history of Echocardiogram examinations.                  Nstemi; Risk Factors:Hypertension.  Sonographer:     Rainelle Gull Referring Phys:  [phone] CHRISTOPHER END Diagnosing Phys: Lonni Hanson MD IMPRESSIONS  1. Left ventricular ejection fraction, by estimation, is 20 to 25%. The left ventricle has severely decreased function. The left ventricle demonstrates global hypokinesis. Left ventricular diastolic parameters are indeterminate.  2. Right ventricular systolic function is normal. The right ventricular size is normal. There is mildly elevated pulmonary artery systolic pressure.  3. Left atrial size was mildly dilated.  4. Moderate pleural effusion in the left lateral region.  5. The mitral valve is normal in structure. Mild to moderate mitral valve regurgitation.  6. The aortic valve has an indeterminant number of cusps. There is mild calcification of the aortic valve. There is mild thickening of the aortic valve. Aortic valve regurgitation is not  visualized. Aortic valve sclerosis/calcification is present, without any evidence of aortic stenosis.  7. The inferior vena cava is dilated in size with <50% respiratory variability, suggesting right atrial pressure of 15 mmHg. FINDINGS  Left Ventricle: Left ventricular ejection fraction, by estimation, is 20 to 25%. The left ventricle has severely decreased function. The left ventricle demonstrates global hypokinesis. The left ventricular internal cavity size was normal in size. There is borderline left ventricular hypertrophy. Abnormal (paradoxical) septal motion, consistent with left bundle branch block. Left ventricular diastolic parameters are indeterminate. Right Ventricle: The right ventricular size is normal. No increase in right ventricular wall thickness. Right ventricular systolic function is normal. There is mildly elevated pulmonary artery systolic pressure. The tricuspid regurgitant velocity is 2.73  m/s, and with an assumed right atrial pressure of 15 mmHg, the estimated right ventricular systolic pressure is 44.8 mmHg. Left Atrium: Left atrial size was mildly dilated. Right Atrium: Right atrial size was normal in size. Pericardium: There is no evidence of pericardial effusion. Mitral Valve: The mitral valve is normal in structure. Mild to moderate mitral valve regurgitation. Tricuspid Valve: The tricuspid valve is not well visualized. Tricuspid valve regurgitation is mild. Aortic Valve: The aortic valve has an indeterminant number of cusps. There is mild calcification of the aortic valve. There is mild thickening of the aortic valve. Aortic valve regurgitation is not visualized. Aortic valve sclerosis/calcification is present, without any evidence of aortic stenosis. Aortic valve mean gradient measures 3.0 mmHg. Aortic valve peak gradient measures 6.4 mmHg. Aortic valve area, by VTI measures 1.58 cm. Pulmonic Valve: The pulmonic valve was not well visualized. Aorta: The aortic root and ascending  aorta are structurally normal, with no evidence of dilitation. Pulmonary Artery: The pulmonary artery is not well seen. Venous: The inferior vena cava is dilated in size with less than 50% respiratory variability, suggesting right atrial pressure of 15 mmHg. IAS/Shunts: The interatrial septum was not well visualized. Additional Comments: There is a moderate pleural effusion in the left lateral region.  LEFT VENTRICLE PLAX 2D LVIDd:         5.10 cm LVIDs:         4.30 cm LV PW:         1.00 cm LV IVS:        0.90 cm LVOT diam:     2.00 cm LV SV:         30 LV SV Index:   15 LVOT Area:     3.14 cm  LV Volumes (MOD) LV vol d, MOD A2C: 138.0 ml LV vol d, MOD A4C: 109.0 ml LV vol s, MOD A2C: 85.3 ml LV vol s, MOD A4C: 71.8 ml LV SV MOD A2C:     52.7 ml LV SV MOD A4C:     109.0 ml LV SV MOD BP:      44.2 ml RIGHT VENTRICLE RV Basal diam:  3.70 cm RV Mid diam:    3.10 cm RV S prime:     13.40 cm/s TAPSE (M-mode): 1.7 cm LEFT ATRIUM             Index  RIGHT ATRIUM           Index LA Vol (A2C):   79.0 ml 40.72 ml/m  RA Area:     11.00 cm LA Vol (A4C):   78.7 ml 40.57 ml/m  RA Volume:   23.00 ml  11.86 ml/m LA Biplane Vol: 78.7 ml 40.57 ml/m  AORTIC VALVE AV Area (Vmax):    1.67 cm AV Area (Vmean):   1.71 cm AV Area (VTI):     1.58 cm AV Vmax:           126.00 cm/s AV Vmean:          84.700 cm/s AV VTI:            0.187 m AV Peak Grad:      6.4 mmHg AV Mean Grad:      3.0 mmHg LVOT Vmax:         67.00 cm/s LVOT Vmean:        46.000 cm/s LVOT VTI:          0.094 m LVOT/AV VTI ratio: 0.50  AORTA Ao Root diam: 2.70 cm Ao Asc diam:  2.40 cm MR Peak grad:    85.4 mmHg    TRICUSPID VALVE MR Mean grad:    46.0 mmHg    TR Peak grad:   29.8 mmHg MR Vmax:         462.00 cm/s  TR Vmax:        273.00 cm/s MR Vmean:        305.0 cm/s MR PISA:         2.26 cm     SHUNTS MR PISA Eff ROA: 14 mm       Systemic VTI:  0.09 m MR PISA Radius:  0.60 cm      Systemic Diam: 2.00 cm Lonni Hanson MD Electronically signed by  Lonni Hanson MD Signature Date/Time: 03/07/2024/5:59:05 PM    Final    DG Chest Portable 1 View Result Date: 03/07/2024 EXAM: 1 VIEW(S) XRAY OF THE CHEST 03/07/2024 10:26:00 AM COMPARISON: Comparison 06/24/2012. CLINICAL HISTORY: Respiratory distress. FINDINGS: LUNGS AND PLEURA: New Bilateral patchy airspace opacities, right worse than left. Diffuse interstitial prominence. No pleural effusion. No pneumothorax. HEART AND MEDIASTINUM: Aortic calcification. No acute abnormality of the cardiac and mediastinal silhouettes. BONES AND SOFT TISSUES: No acute osseous abnormality. IMPRESSION: 1. Bilateral patchy airspace opacities, right worse than left, with diffuse interstitial prominence, which may reflect atypical pulmonary edema and/or multifocal infection. Electronically signed by: Katheleen Faes MD 03/07/2024 10:31 AM EST RP Workstation: [id number]      Assessment and Plan:  #Acute hypoxic respiratory failure  #acute decompensated HFrEF, +/- multifocal pneumonia  #T2NSTEMI   - on bipap, wean as tolerated  - diuresis per Cardiology , monitor I/O, daily chemistry   - ECHO, f/u CTA  - trend trop/EKG, monitor on telemetry. No CP  - Cardiology recommendations appreciated.  Plan for left and right heart catheterization once stable - asa, crestor    #Sepsis  - meeting criteria with mild leukocytosis, tachycardia, tachypnea, possible pneumonia.  HD stable.  Lactic 3.1, likely 2/2 increased WOB.  - empiric rocephin /doxycycline   - no IVF given above  - blood cultures pending   #HTN  - hold antihypertensives for now. BP initially soft but fluid responsive. resume as indicated   #Hx GIB  - 2/2 NSAID induced gastropathy years ago. hgb stable, monitor   - protonix     NPO  No IVF Monitor/replace electrolytes  Lovenox   Advance Care Planning:   Code Status: Full Code discussed with patient at time of admission  Consults: Cardiology   Family Communication: Son and daughter in law present at  bedside   Severity of Illness: The appropriate patient status for this patient is INPATIENT. Inpatient status is judged to be reasonable and necessary in order to provide the required intensity of service to ensure the patient's safety. The patient's presenting symptoms, physical exam findings, and initial radiographic and laboratory data in the context of their chronic comorbidities is felt to place them at high risk for further clinical deterioration. Furthermore, it is not anticipated that the patient will be medically stable for discharge from the hospital within 2 midnights of admission.   * I certify that at the point of admission it is my clinical judgment that the patient will require inpatient hospital care spanning beyond 2 midnights from the point of admission due to high intensity of service, high risk for further deterioration and high frequency of surveillance required.*  Author: Daved JAYSON Pump, DO 03/07/2024 6:46 PM  For on call review [email or web address].      [1] No Known Allergies  "

## 2024-03-07 NOTE — ED Notes (Signed)
 RT in room and placed pt on cpap

## 2024-03-08 ENCOUNTER — Encounter: Payer: Self-pay | Admitting: Emergency Medicine

## 2024-03-08 DIAGNOSIS — I5021 Acute systolic (congestive) heart failure: Secondary | ICD-10-CM

## 2024-03-08 DIAGNOSIS — J810 Acute pulmonary edema: Principal | ICD-10-CM

## 2024-03-08 DIAGNOSIS — J189 Pneumonia, unspecified organism: Secondary | ICD-10-CM

## 2024-03-08 DIAGNOSIS — R0603 Acute respiratory distress: Secondary | ICD-10-CM

## 2024-03-08 DIAGNOSIS — I10 Essential (primary) hypertension: Secondary | ICD-10-CM | POA: Diagnosis not present

## 2024-03-08 DIAGNOSIS — I447 Left bundle-branch block, unspecified: Secondary | ICD-10-CM | POA: Diagnosis not present

## 2024-03-08 DIAGNOSIS — J9601 Acute respiratory failure with hypoxia: Secondary | ICD-10-CM

## 2024-03-08 LAB — CBC
HCT: 45.9 % (ref 39.0–52.0)
Hemoglobin: 15.1 g/dL (ref 13.0–17.0)
MCH: 30.8 pg (ref 26.0–34.0)
MCHC: 32.9 g/dL (ref 30.0–36.0)
MCV: 93.5 fL (ref 80.0–100.0)
Platelets: 220 10*3/uL (ref 150–400)
RBC: 4.91 MIL/uL (ref 4.22–5.81)
RDW: 12.6 % (ref 11.5–15.5)
WBC: 9.3 10*3/uL (ref 4.0–10.5)
nRBC: 0 % (ref 0.0–0.2)

## 2024-03-08 LAB — LIPID PANEL
Cholesterol: 93 mg/dL (ref 0–200)
HDL: 37 mg/dL — ABNORMAL LOW
LDL Cholesterol: 40 mg/dL (ref 0–99)
Total CHOL/HDL Ratio: 2.5 ratio
Triglycerides: 78 mg/dL
VLDL: 16 mg/dL (ref 0–40)

## 2024-03-08 LAB — URINE CULTURE: Culture: <10000 — AB

## 2024-03-08 LAB — COMPREHENSIVE METABOLIC PANEL WITH GFR
ALT: 12 U/L (ref 0–44)
AST: 40 U/L (ref 15–41)
Albumin: 3.6 g/dL (ref 3.5–5.0)
Alkaline Phosphatase: 27 U/L — ABNORMAL LOW (ref 38–126)
Anion gap: 15 (ref 5–15)
BUN: 22 mg/dL (ref 8–23)
CO2: 21 mmol/L — ABNORMAL LOW (ref 22–32)
Calcium: 8.4 mg/dL — ABNORMAL LOW (ref 8.9–10.3)
Chloride: 101 mmol/L (ref 98–111)
Creatinine, Ser: 1.26 mg/dL — ABNORMAL HIGH (ref 0.61–1.24)
GFR, Estimated: 55 mL/min — ABNORMAL LOW
Glucose, Bld: 121 mg/dL — ABNORMAL HIGH (ref 70–99)
Potassium: 3.8 mmol/L (ref 3.5–5.1)
Sodium: 137 mmol/L (ref 135–145)
Total Bilirubin: 1.6 mg/dL — ABNORMAL HIGH (ref 0.0–1.2)
Total Protein: 6.4 g/dL — ABNORMAL LOW (ref 6.5–8.1)

## 2024-03-08 MED ORDER — OSELTAMIVIR PHOSPHATE 30 MG PO CAPS
30.0000 mg | ORAL_CAPSULE | Freq: Two times a day (BID) | ORAL | Status: AC
  Administered 2024-03-08 – 2024-03-12 (×9): 30 mg via ORAL
  Filled 2024-03-08 (×10): qty 1

## 2024-03-08 MED ORDER — PANTOPRAZOLE SODIUM 40 MG PO TBEC
40.0000 mg | DELAYED_RELEASE_TABLET | Freq: Every day | ORAL | Status: AC
  Administered 2024-03-08 – 2024-03-12 (×5): 40 mg via ORAL
  Filled 2024-03-08 (×5): qty 1

## 2024-03-08 MED ORDER — OSELTAMIVIR PHOSPHATE 75 MG PO CAPS
75.0000 mg | ORAL_CAPSULE | Freq: Once | ORAL | Status: AC
  Administered 2024-03-08: 75 mg via ORAL
  Filled 2024-03-08 (×2): qty 1

## 2024-03-08 NOTE — ED Notes (Signed)
 Cardiology at bedside.

## 2024-03-08 NOTE — Progress Notes (Signed)
" °  PROGRESS NOTE    Caleb Reynolds  FMW:[id number] DOB: 1935/06/17 DOA: 03/07/2024 PCP: Alla Amis, MD  [id number]/[id number]  LOS: 1 day   Brief hospital course:   Assessment & Plan: Caleb Reynolds is a 89 y.o. male with medical history significant of HTN, HLD, Alzheimers dementia but lives independently, CVA without residual deficits, GI bleeding 2/2 NSAID induced PUD, presents to the ED for evaluaiton of sudden onset SOB.  87% RA -- non-rebreather at 95% -- during transport desat and placed on CPAP at 88%.     #Acute hypoxic respiratory failure  #acute decompensated HFrEF  --on 6L today. --current Echo showed LVEF 20 to 25%. --cardio consulted --cont IV lasix  40 BID   #Sepsis, ruled out # PNA ruled out --d/c abx   #HTN  - BP initially soft but fluid responsive. --hold antihypertensives   #Hx GIB  - 2/2 NSAID induced gastropathy years ago. hgb stable, monitor   --cont PPI  # Flu A --start Tamiflu   #T2NSTEMI     DVT prophylaxis: Lovenox  SQ Code Status: Full code  Family Communication: family updated at bedside today Level of care: Progressive Dispo:   The patient is from: home Anticipated d/c is to: home Anticipated d/c date is: > 3 days   Subjective and Interval History:  Pt reported no dyspnea.  Had some cough.   Objective: Vitals:   03/08/24 1415 03/08/24 1442 03/08/24 1630 03/08/24 1817  BP:   111/70   Pulse: (!) 117  (!) 117   Resp: (!) 30  (!) 32   Temp:  97.8 F (36.6 C)  (!) 101 F (38.3 C)  TempSrc:  Oral  Oral  SpO2:   94%   Weight:      Height:        Intake/Output Summary (Last 24 hours) at 03/08/2024 1822 Last data filed at 03/08/2024 1506 Gross per 24 hour  Intake 251.76 ml  Output 1200 ml  Net -948.24 ml   Filed Weights   03/07/24 1012  Weight: 74.6 kg    Examination:   Constitutional: NAD, alert, oriented to person and place HEENT: conjunctivae and lids normal, EOMI CV: No cyanosis.   RESP: normal respiratory  effort, on 6L Neuro: II - XII grossly intact.   Psych: Normal mood and affect.     Data Reviewed: I have personally reviewed labs and imaging studies  Time spent: 50 minutes  Ellouise Haber, MD [facility] If 7PM-7AM, please contact night-coverage 03/08/2024, 6:22 PM   "

## 2024-03-08 NOTE — Progress Notes (Signed)
 PHARMACIST - PHYSICIAN COMMUNICATION  DR:   Awanda  CONCERNING: IV to Oral Route Change Policy  RECOMMENDATION: This patient is receiving pantoprazole  by the intravenous route.  Based on criteria approved by the Pharmacy and Therapeutics Committee, the intravenous medication(s) is/are being converted to the equivalent oral dose form(s).   DESCRIPTION: These criteria include: The patient is eating (either orally or via tube) and/or has been taking other orally administered medications for a least 24 hours The patient has no evidence of active gastrointestinal bleeding or impaired GI absorption (gastrectomy, short bowel, patient on TNA or NPO).  If you have questions about this conversion, please contact the Pharmacy Department  []   [phone] )  [facility] [x]   [phone] )  [facility] []   [phone] )  [facility] []   [phone] )  [facility] []   [phone] )  [facility] 03/08/2024 1:27 PM

## 2024-03-08 NOTE — Progress Notes (Signed)
 "  Rounding Note   Patient Name: Caleb Reynolds Date of Encounter: 03/08/2024  [facility] Cardiologist: None  Consult done by Dr End  Subjective Seen on AM rounds. Family remains at the bedside. Shortness of breath continues to improve. Bipap was removed and he has been placed on 6L O2 via [place].   Scheduled Meds:  aspirin  EC  81 mg Oral Daily   enoxaparin  (LOVENOX ) injection  40 mg Subcutaneous Q24H   furosemide   40 mg Intravenous BID   oseltamivir   30 mg Oral BID   pantoprazole  (PROTONIX ) IV  40 mg Intravenous Q24H   rivastigmine   1.5 mg Oral BID   rosuvastatin   20 mg Oral QHS   Continuous Infusions:  PRN Meds: acetaminophen  **OR** acetaminophen , ondansetron  **OR** ondansetron  (ZOFRAN ) IV   Vital Signs  Vitals:   03/08/24 0700 03/08/24 0730 03/08/24 0900 03/08/24 0943  BP: 116/89 133/79 109/68   Pulse: (!) 110 (!) 115 (!) 109 (!) 113  Resp: (!) 32 (!) 38 (!) 34 (!) 23  Temp:    97.6 F (36.4 C)  TempSrc:    Oral  SpO2:  93% 96%   Weight:      Height:        Intake/Output Summary (Last 24 hours) at 03/08/2024 0955 Last data filed at 03/08/2024 0232 Gross per 24 hour  Intake 251.76 ml  Output 600 ml  Net -348.24 ml      03/07/2024   10:12 AM 05/22/2019   12:13 PM 03/11/2016    9:22 AM  Last 3 Weights  Weight (lbs) 164 lb 6.4 oz 150 lb 155 lb  Weight (kg) 74.571 kg 68.04 kg 70.308 kg      Telemetry Sinus tach rates of 113 with LBBB - Personally Reviewed  ECG  No new tracings - Personally Reviewed  Physical Exam  GEN: Elderly but in no acute distress. Neck: No JVD Cardiac: RRR, tachycardic, no murmurs, rubs, or gallops.  Respiratory: Diminished to auscultation bilaterally. Respirations are unlabored at rest on 6L O2 via [place] GI: Soft, nontender, non-distended  MS: No edema; No deformity. Neuro:  Nonfocal  Psych: Normal affect   Labs High Sensitivity Troponin:  No results for input(s): TROPONINIHS in the last 720 hours.  Recent Labs  Lab  03/07/24 1019 03/07/24 1227  TRNPT 91* 113*       Chemistry Recent Labs  Lab 03/07/24 1019 03/08/24 0356  NA 139 137  K 4.3 3.8  CL 102 101  CO2 18* 21*  GLUCOSE 190* 121*  BUN 19 22  CREATININE 1.20 1.26*  CALCIUM  9.8 8.4*  MG 2.2  --   PROT 7.1 6.4*  ALBUMIN 3.9 3.6  AST 24 40  ALT 10 12  ALKPHOS 33* 27*  BILITOT 2.2* 1.6*  GFRNONAA 59* 55*  ANIONGAP 19* 15    Lipids  Recent Labs  Lab 03/08/24 0356  CHOL 93  TRIG 78  HDL 37*  LDLCALC 40  CHOLHDL 2.5    Hematology Recent Labs  Lab 03/07/24 1019 03/08/24 0356  WBC 12.6* 9.3  RBC 5.32 4.91  HGB 16.5 15.1  HCT 49.9 45.9  MCV 93.8 93.5  MCH 31.0 30.8  MCHC 33.1 32.9  RDW 12.8 12.6  PLT 271 220   Thyroid  Recent Labs  Lab 03/07/24 1019  TSH 1.460  FREET4 1.22    BNP Recent Labs  Lab 03/07/24 1019  PROBNP 2,259.0*    DDimer No results for input(s): DDIMER in the last  168 hours.   Radiology  CT Angio Chest PE W and/or Wo Contrast Result Date: 03/07/2024 CLINICAL DATA:  New atrial fibrillation.  Respiratory distress. EXAM: CT ANGIOGRAPHY CHEST WITH CONTRAST TECHNIQUE: Multidetector CT imaging of the chest was performed using the standard protocol during bolus administration of intravenous contrast. Multiplanar CT image reconstructions and MIPs were obtained to evaluate the vascular anatomy. RADIATION DOSE REDUCTION: This exam was performed according to the departmental dose-optimization program which includes automated exposure control, adjustment of the mA and/or kV according to patient size and/or use of iterative reconstruction technique. CONTRAST:  OMNIPAQUE  IOHEXOL  350 MG/ML SOLN COMPARISON:  Chest x-ray 03/07/2024 FINDINGS: Cardiovascular: Satisfactory opacification of the pulmonary arteries to the segmental level. No evidence of pulmonary embolism. The main pulmonary artery is normal in caliber. Normal heart size. No significant pericardial effusion. The thoracic aorta is normal in  caliber. Severe atherosclerotic plaque of the thoracic aorta. Four-vessel coronary artery calcifications. Mediastinum/Nodes: No enlarged mediastinal, hilar, or axillary lymph nodes. Thyroid gland, trachea demonstrate no significant findings. Small to moderate volume hiatal hernia. Esophagus is unremarkable. Lungs/Pleura: Bilateral peribronchovascular patchy ground-glass and consolidative airspace opacities that are more consolidative within the right lower lobe and right middle lobe. No pulmonary nodule. No pulmonary mass. Moderate right and small to moderate left pleural effusions. Associated bilateral lower lobe passive atelectasis. No pneumothorax. Upper Abdomen: Layering hyperdensity within the gallbladder lumen that may represent tiny calcified gallstones versus gallbladder sludge. Musculoskeletal: No abdominal wall hernia or abnormality. No suspicious lytic or blastic osseous lesions. No acute displaced fracture. Multilevel degenerative changes of the spine. Review of the MIP images confirms the above findings. IMPRESSION: 1. No pulmonary embolus. 2. Bilateral peribronchovascular patchy ground-glass and consolidative airspace opacities that are more consolidative within the right lower lobe and right middle lobe. Findings suggestive of infection/inflammation versus pulmonary edema/ARDS. 3. Moderate right and small to moderate left pleural effusions. 4. Severe atherosclerotic plaque with four-vessel coronary artery calcification. These findings were dictated once images were made available on PACS. Electronically Signed   By: Morgane  Naveau M.D.   On: 03/07/2024 20:42   ECHOCARDIOGRAM COMPLETE Result Date: 03/07/2024    ECHOCARDIOGRAM REPORT   Patient Name:   Caleb Reynolds Date of Exam: 03/07/2024 Medical Rec #:  [id number]         Height:       71.0 in Accession #:    [id number]        Weight:       164.4 lb Date of Birth:  1935-08-16         BSA:          1.940 m Patient Age:    89 years          BP:            118/79 mmHg Patient Gender: M                 HR:           122 bpm. Exam Location:  [facility] Procedure: 2D Echo, Color Doppler and Cardiac Doppler (Both Spectral and Color            Flow Doppler were utilized during procedure). Indications:     Systolic Heart Failure  History:         Patient has prior history of Echocardiogram examinations.                  Nstemi; Risk Factors:Hypertension.  Sonographer:  Rainelle Gull Referring Phys:  [id number] CHRISTOPHER END Diagnosing Phys: Lonni Hanson MD IMPRESSIONS  1. Left ventricular ejection fraction, by estimation, is 20 to 25%. The left ventricle has severely decreased function. The left ventricle demonstrates global hypokinesis. Left ventricular diastolic parameters are indeterminate.  2. Right ventricular systolic function is normal. The right ventricular size is normal. There is mildly elevated pulmonary artery systolic pressure.  3. Left atrial size was mildly dilated.  4. Moderate pleural effusion in the left lateral region.  5. The mitral valve is normal in structure. Mild to moderate mitral valve regurgitation.  6. The aortic valve has an indeterminant number of cusps. There is mild calcification of the aortic valve. There is mild thickening of the aortic valve. Aortic valve regurgitation is not visualized. Aortic valve sclerosis/calcification is present, without any evidence of aortic stenosis.  7. The inferior vena cava is dilated in size with <50% respiratory variability, suggesting right atrial pressure of 15 mmHg. FINDINGS  Left Ventricle: Left ventricular ejection fraction, by estimation, is 20 to 25%. The left ventricle has severely decreased function. The left ventricle demonstrates global hypokinesis. The left ventricular internal cavity size was normal in size. There is borderline left ventricular hypertrophy. Abnormal (paradoxical) septal motion, consistent with left bundle branch block. Left ventricular diastolic parameters are indeterminate.  Right Ventricle: The right ventricular size is normal. No increase in right ventricular wall thickness. Right ventricular systolic function is normal. There is mildly elevated pulmonary artery systolic pressure. The tricuspid regurgitant velocity is 2.73  m/s, and with an assumed right atrial pressure of 15 mmHg, the estimated right ventricular systolic pressure is 44.8 mmHg. Left Atrium: Left atrial size was mildly dilated. Right Atrium: Right atrial size was normal in size. Pericardium: There is no evidence of pericardial effusion. Mitral Valve: The mitral valve is normal in structure. Mild to moderate mitral valve regurgitation. Tricuspid Valve: The tricuspid valve is not well visualized. Tricuspid valve regurgitation is mild. Aortic Valve: The aortic valve has an indeterminant number of cusps. There is mild calcification of the aortic valve. There is mild thickening of the aortic valve. Aortic valve regurgitation is not visualized. Aortic valve sclerosis/calcification is present, without any evidence of aortic stenosis. Aortic valve mean gradient measures 3.0 mmHg. Aortic valve peak gradient measures 6.4 mmHg. Aortic valve area, by VTI measures 1.58 cm. Pulmonic Valve: The pulmonic valve was not well visualized. Aorta: The aortic root and ascending aorta are structurally normal, with no evidence of dilitation. Pulmonary Artery: The pulmonary artery is not well seen. Venous: The inferior vena cava is dilated in size with less than 50% respiratory variability, suggesting right atrial pressure of 15 mmHg. IAS/Shunts: The interatrial septum was not well visualized. Additional Comments: There is a moderate pleural effusion in the left lateral region.  LEFT VENTRICLE PLAX 2D LVIDd:         5.10 cm LVIDs:         4.30 cm LV PW:         1.00 cm LV IVS:        0.90 cm LVOT diam:     2.00 cm LV SV:         30 LV SV Index:   15 LVOT Area:     3.14 cm  LV Volumes (MOD) LV vol d, MOD A2C: 138.0 ml LV vol d, MOD A4C: 109.0  ml LV vol s, MOD A2C: 85.3 ml LV vol s, MOD A4C: 71.8 ml LV SV MOD A2C:  52.7 ml LV SV MOD A4C:     109.0 ml LV SV MOD BP:      44.2 ml RIGHT VENTRICLE RV Basal diam:  3.70 cm RV Mid diam:    3.10 cm RV S prime:     13.40 cm/s TAPSE (M-mode): 1.7 cm LEFT ATRIUM             Index        RIGHT ATRIUM           Index LA Vol (A2C):   79.0 ml 40.72 ml/m  RA Area:     11.00 cm LA Vol (A4C):   78.7 ml 40.57 ml/m  RA Volume:   23.00 ml  11.86 ml/m LA Biplane Vol: 78.7 ml 40.57 ml/m  AORTIC VALVE AV Area (Vmax):    1.67 cm AV Area (Vmean):   1.71 cm AV Area (VTI):     1.58 cm AV Vmax:           126.00 cm/s AV Vmean:          84.700 cm/s AV VTI:            0.187 m AV Peak Grad:      6.4 mmHg AV Mean Grad:      3.0 mmHg LVOT Vmax:         67.00 cm/s LVOT Vmean:        46.000 cm/s LVOT VTI:          0.094 m LVOT/AV VTI ratio: 0.50  AORTA Ao Root diam: 2.70 cm Ao Asc diam:  2.40 cm MR Peak grad:    85.4 mmHg    TRICUSPID VALVE MR Mean grad:    46.0 mmHg    TR Peak grad:   29.8 mmHg MR Vmax:         462.00 cm/s  TR Vmax:        273.00 cm/s MR Vmean:        305.0 cm/s MR PISA:         2.26 cm     SHUNTS MR PISA Eff ROA: 14 mm       Systemic VTI:  0.09 m MR PISA Radius:  0.60 cm      Systemic Diam: 2.00 cm Lonni Hanson MD Electronically signed by Lonni Hanson MD Signature Date/Time: 03/07/2024/5:59:05 PM    Final    DG Chest Portable 1 View Result Date: 03/07/2024 EXAM: 1 VIEW(S) XRAY OF THE CHEST 03/07/2024 10:26:00 AM COMPARISON: Comparison 06/24/2012. CLINICAL HISTORY: Respiratory distress. FINDINGS: LUNGS AND PLEURA: New Bilateral patchy airspace opacities, right worse than left. Diffuse interstitial prominence. No pleural effusion. No pneumothorax. HEART AND MEDIASTINUM: Aortic calcification. No acute abnormality of the cardiac and mediastinal silhouettes. BONES AND SOFT TISSUES: No acute osseous abnormality. IMPRESSION: 1. Bilateral patchy airspace opacities, right worse than left, with diffuse  interstitial prominence, which may reflect atypical pulmonary edema and/or multifocal infection. Electronically signed by: Katheleen Faes MD 03/07/2024 10:31 AM EST RP Workstation: [id number]    Cardiac Studies 2d echo 03/07/2024 1. Left ventricular ejection fraction, by estimation, is 20 to 25%. The  left ventricle has severely decreased function. The left ventricle  demonstrates global hypokinesis. Left ventricular diastolic parameters are  indeterminate.   2. Right ventricular systolic function is normal. The right ventricular  size is normal. There is mildly elevated pulmonary artery systolic  pressure.   3. Left atrial size was mildly dilated.   4. Moderate pleural effusion in the left lateral region.  5. The mitral valve is normal in structure. Mild to moderate mitral valve  regurgitation.   6. The aortic valve has an indeterminant number of cusps. There is mild  calcification of the aortic valve. There is mild thickening of the aortic  valve. Aortic valve regurgitation is not visualized. Aortic valve  sclerosis/calcification is present,  without any evidence of aortic stenosis.   7. The inferior vena cava is dilated in size with <50% respiratory  variability, suggesting right atrial pressure of 15 mmHg.   Patient Profile   89 y.o. male with past medical history of hypertension, hyperlipidemia, stroke, peptic ulcer disease with at least 2 significant GI bleeds, kidney stones, stomach disease, who has been seen and evaluated for abnormal EKG and shortness of breath with newly found cardiomyopathy on echocardiogram with an LVEF of 20-25%.  Assessment & Plan  Acute HFrEF -Presented with malaise and increasing shortness of breath over 3 to 4 days -Echocardiogram revealed severely reduced LVEF of 20-25% -No accurate I's and O's documented as he remains boarding in the emergency department Continue to hold furosemide  40 mg IV twice daily- -if blood pressure and renal function allow  addition of ACE inhibitor or ARB should be considered, escalate GDMT as tolerated by blood pressure and kidney function -Does not appear to be significantly volume overloaded on exam -Daily BMP while on diuretic therapy - Daily weights and I's and O's -Heart failure education - Once volume status has improved we will need further ischemic evaluation with right and left heart catheterization  Acute respiratory failure with hypoxia -Likely secondary to acute HFrEF with pulmonary edema bilateral pleural effusions though concurrent multifactoral pneumonia cannot be excluded -Continue with diuresis -Titrated off of BiPAP to 6 L O2 via nasal cannula earlier today -Continue to titrate FiO2 to maintain oxygen saturations of greater than equal to 92% -Antibiotic therapy previously discontinued with ongoing management per IM -Flu A positive and started on Tamiflu  today - Supportive care -Management per IM  Elevated high-sensitivity troponin -High-sensitivity troponin peaked at 113 -Likely supply/demand mismatch in the setting of acute HFrEF and hypoxic respiratory failure -Once his volume status and hypoxia have improved we will need to consider right and left heart catheterization to exclude ischemic substrate and hemodynamics to better understand and Treatment   For questions or updates, please contact [place] [facility] Please consult [email or web address] for contact info under       Signed, Nalaysia Manganiello, NP  03/08/2024, 9:55 AM    "

## 2024-03-09 ENCOUNTER — Telehealth (HOSPITAL_COMMUNITY): Payer: Self-pay

## 2024-03-09 ENCOUNTER — Other Ambulatory Visit (HOSPITAL_COMMUNITY): Payer: Self-pay

## 2024-03-09 DIAGNOSIS — J810 Acute pulmonary edema: Secondary | ICD-10-CM | POA: Diagnosis not present

## 2024-03-09 DIAGNOSIS — I447 Left bundle-branch block, unspecified: Secondary | ICD-10-CM | POA: Diagnosis not present

## 2024-03-09 DIAGNOSIS — I420 Dilated cardiomyopathy: Secondary | ICD-10-CM

## 2024-03-09 DIAGNOSIS — J9601 Acute respiratory failure with hypoxia: Secondary | ICD-10-CM | POA: Diagnosis not present

## 2024-03-09 DIAGNOSIS — I5021 Acute systolic (congestive) heart failure: Secondary | ICD-10-CM | POA: Diagnosis not present

## 2024-03-09 DIAGNOSIS — R7989 Other specified abnormal findings of blood chemistry: Secondary | ICD-10-CM

## 2024-03-09 LAB — BASIC METABOLIC PANEL WITH GFR
Anion gap: 15 (ref 5–15)
BUN: 29 mg/dL — ABNORMAL HIGH (ref 8–23)
CO2: 22 mmol/L (ref 22–32)
Calcium: 8.1 mg/dL — ABNORMAL LOW (ref 8.9–10.3)
Chloride: 100 mmol/L (ref 98–111)
Creatinine, Ser: 1.24 mg/dL (ref 0.61–1.24)
GFR, Estimated: 56 mL/min — ABNORMAL LOW
Glucose, Bld: 94 mg/dL (ref 70–99)
Potassium: 3.4 mmol/L — ABNORMAL LOW (ref 3.5–5.1)
Sodium: 138 mmol/L (ref 135–145)

## 2024-03-09 LAB — MAGNESIUM: Magnesium: 2.2 mg/dL (ref 1.7–2.4)

## 2024-03-09 MED ORDER — FUROSEMIDE 10 MG/ML IJ SOLN
40.0000 mg | Freq: Two times a day (BID) | INTRAMUSCULAR | Status: DC
  Administered 2024-03-10 – 2024-03-11 (×3): 40 mg via INTRAVENOUS
  Filled 2024-03-09 (×3): qty 4

## 2024-03-09 MED ORDER — POTASSIUM CHLORIDE CRYS ER 20 MEQ PO TBCR
40.0000 meq | EXTENDED_RELEASE_TABLET | Freq: Once | ORAL | Status: AC
  Administered 2024-03-09: 40 meq via ORAL
  Filled 2024-03-09: qty 2

## 2024-03-09 MED ORDER — FREE WATER
500.0000 mL | Freq: Once | Status: AC
  Administered 2024-03-10: 500 mL via ORAL

## 2024-03-09 MED ORDER — ASPIRIN 81 MG PO CHEW
81.0000 mg | CHEWABLE_TABLET | ORAL | Status: AC
  Administered 2024-03-10: 81 mg via ORAL
  Filled 2024-03-09: qty 1

## 2024-03-09 NOTE — Telephone Encounter (Signed)
 Pharmacy Patient Advocate Encounter  Insurance verification completed.    The patient is insured through [facility]. Patient has Medicare and is not eligible for a copay card, but may be able to apply for patient assistance or Medicare RX Payment Plan (Patient Must reach out to their plan, if eligible for payment plan), if available.    Ran test claim for Farxiga 10mg  tablet and the current 30 day co-pay is $138.90 due to deductible.  Ran test claim for Jardiance 10mg  tablet and the current 30 day co-pay is $142.68 due to deductible.  Ran test claim for Entresto 24-26mg  tablet and the current 30 day co-pay is $54.50 due to deductible.  This test claim was processed through [facility]- copay amounts may vary at other pharmacies due to pharmacy/plan contracts, or as the patient moves through the different stages of their insurance plan.

## 2024-03-09 NOTE — TOC CM/SW Note (Signed)
 Transition of Care [facility]) - Inpatient Brief Assessment   Patient Details  Name: Caleb Reynolds MRN: [id number] Date of Birth: [birth year]  Transition of Care [facility]) CM/SW Contact:    Shasta DELENA Daring, RN Phone Number: 03/09/2024, 10:44 AM   Clinical Narrative: Transition of Care Department [facility]) has reviewed patient and no TOC needs have been identified at this time.  If new patient transition needs arise, please place a TOC consult.    Transition of Care Asessment: Insurance and Status: Insurance coverage has been reviewed Patient has primary care physician: Yes Home environment has been reviewed: Single family home Prior level of function:: independent   Social Drivers of Health Review: SDOH reviewed no interventions necessary Readmission risk has been reviewed: Yes Transition of care needs: no transition of care needs at this time

## 2024-03-09 NOTE — Plan of Care (Signed)

## 2024-03-09 NOTE — Plan of Care (Signed)

## 2024-03-09 NOTE — H&P (View-Only) (Signed)
 "  Progress Note  Patient Name: Caleb Reynolds Date of Encounter: 03/09/2024 [facility] Cardiologist: None   Interval Summary    Patient is overall feeling better. UOP -1.1L. Breathing improving. Kidney function stable. Patient is agreeable to heart cath.  Vital Signs Vitals:   03/08/24 2359 03/09/24 0430 03/09/24 0500 03/09/24 0806  BP: 107/66 103/67  105/66  Pulse: 100 97  96  Resp: 18 18    Temp: 97.8 F (36.6 C) 98.2 F (36.8 C)  98.2 F (36.8 C)  TempSrc:    Oral  SpO2: 95% 94%  95%  Weight:   65.8 kg   Height:        Intake/Output Summary (Last 24 hours) at 03/09/2024 1035 Last data filed at 03/09/2024 0100 Gross per 24 hour  Intake 200 ml  Output 1100 ml  Net -900 ml      03/09/2024    5:00 AM 03/07/2024   10:12 AM 05/22/2019   12:13 PM  Last 3 Weights  Weight (lbs) 145 lb 1 oz 164 lb 6.4 oz 150 lb  Weight (kg) 65.8 kg 74.571 kg 68.04 kg      Telemetry/ECG   ST up to 120s - Personally Reviewed  Physical Exam  GEN: No acute distress.   Neck: No JVD Cardiac: RR, tachy, no murmurs, rubs, or gallops.  Respiratory: Clear to auscultation bilaterally. GI: Soft, nontender, non-distended  MS: No edema  Cardiac Studies 2d echo 03/07/2024 1. Left ventricular ejection fraction, by estimation, is 20 to 25%. The  left ventricle has severely decreased function. The left ventricle  demonstrates global hypokinesis. Left ventricular diastolic parameters are  indeterminate.   2. Right ventricular systolic function is normal. The right ventricular  size is normal. There is mildly elevated pulmonary artery systolic  pressure.   3. Left atrial size was mildly dilated.   4. Moderate pleural effusion in the left lateral region.   5. The mitral valve is normal in structure. Mild to moderate mitral valve  regurgitation.   6. The aortic valve has an indeterminant number of cusps. There is mild  calcification of the aortic valve. There is mild thickening of the  aortic  valve. Aortic valve regurgitation is not visualized. Aortic valve  sclerosis/calcification is present,  without any evidence of aortic stenosis.   7. The inferior vena cava is dilated in size with <50% respiratory  variability, suggesting right atrial pressure of 15 mmHg.    Patient Profile   89 y.o. male with past medical history of hypertension, hyperlipidemia, stroke, peptic ulcer disease with at least 2 significant GI bleeds, kidney stones, stomach disease, who has been seen and evaluated for abnormal EKG and shortness of breath with newly found cardiomyopathy on echocardiogram with an LVEF of 20-25%.  Assessment & Plan   Acute HFrEF - presented with malaise and increasing SOB over 3-4 days found to have the flu and acute CHF - Echo showed LVEF 20-25% - IV lasix  40mg  BID - I/Os not accurate. Net -1.2L - he will need R/L heart cath, can possibly tomorrow  Risks and benefits of cardiac catheterization have been discussed with the patient.  These include bleeding, infection, kidney damage, stroke, heart attack, death.  The patient understands these risks and is willing to proceed.  Acute respiratory failure FLU A infection - likely secondary to acute HFrEF with pulmonary edema, bil pleural effusions through concurrent multifactorial PNA cannot be excluded - IV lasix  - off Bipap. On 5L [place] - tamiflu   -  per IM  Elevated troponin CAC on CT - HS trop up to 113, likely supply demand mismatch in the setting of acute HFrEF and acute hypoxic respi failure - ASA 81mg  daily and Crestor  20mg  daily - plan for R/L heart cath as above   For questions or updates, please contact [facility] Please consult [email or web address] for contact info under         Signed, Jalan Fariss VEAR Fishman, PA-C   "

## 2024-03-09 NOTE — Progress Notes (Signed)
 "  Progress Note  Patient Name: Caleb Reynolds Date of Encounter: 03/09/2024 [facility] Cardiologist: None   Interval Summary    Patient is overall feeling better. UOP -1.1L. Breathing improving. Kidney function stable. Patient is agreeable to heart cath.  Vital Signs Vitals:   03/08/24 2359 03/09/24 0430 03/09/24 0500 03/09/24 0806  BP: 107/66 103/67  105/66  Pulse: 100 97  96  Resp: 18 18    Temp: 97.8 F (36.6 C) 98.2 F (36.8 C)  98.2 F (36.8 C)  TempSrc:    Oral  SpO2: 95% 94%  95%  Weight:   65.8 kg   Height:        Intake/Output Summary (Last 24 hours) at 03/09/2024 1035 Last data filed at 03/09/2024 0100 Gross per 24 hour  Intake 200 ml  Output 1100 ml  Net -900 ml      03/09/2024    5:00 AM 03/07/2024   10:12 AM 05/22/2019   12:13 PM  Last 3 Weights  Weight (lbs) 145 lb 1 oz 164 lb 6.4 oz 150 lb  Weight (kg) 65.8 kg 74.571 kg 68.04 kg      Telemetry/ECG   ST up to 120s - Personally Reviewed  Physical Exam  GEN: No acute distress.   Neck: No JVD Cardiac: RR, tachy, no murmurs, rubs, or gallops.  Respiratory: Clear to auscultation bilaterally. GI: Soft, nontender, non-distended  MS: No edema  Cardiac Studies 2d echo 03/07/2024 1. Left ventricular ejection fraction, by estimation, is 20 to 25%. The  left ventricle has severely decreased function. The left ventricle  demonstrates global hypokinesis. Left ventricular diastolic parameters are  indeterminate.   2. Right ventricular systolic function is normal. The right ventricular  size is normal. There is mildly elevated pulmonary artery systolic  pressure.   3. Left atrial size was mildly dilated.   4. Moderate pleural effusion in the left lateral region.   5. The mitral valve is normal in structure. Mild to moderate mitral valve  regurgitation.   6. The aortic valve has an indeterminant number of cusps. There is mild  calcification of the aortic valve. There is mild thickening of the  aortic  valve. Aortic valve regurgitation is not visualized. Aortic valve  sclerosis/calcification is present,  without any evidence of aortic stenosis.   7. The inferior vena cava is dilated in size with <50% respiratory  variability, suggesting right atrial pressure of 15 mmHg.    Patient Profile   89 y.o. male with past medical history of hypertension, hyperlipidemia, stroke, peptic ulcer disease with at least 2 significant GI bleeds, kidney stones, stomach disease, who has been seen and evaluated for abnormal EKG and shortness of breath with newly found cardiomyopathy on echocardiogram with an LVEF of 20-25%.  Assessment & Plan   Acute HFrEF - presented with malaise and increasing SOB over 3-4 days found to have the flu and acute CHF - Echo showed LVEF 20-25% - IV lasix  40mg  BID - I/Os not accurate. Net -1.2L - he will need R/L heart cath, can possibly tomorrow  Risks and benefits of cardiac catheterization have been discussed with the patient.  These include bleeding, infection, kidney damage, stroke, heart attack, death.  The patient understands these risks and is willing to proceed.  Acute respiratory failure FLU A infection - likely secondary to acute HFrEF with pulmonary edema, bil pleural effusions through concurrent multifactorial PNA cannot be excluded - IV lasix  - off Bipap. On 5L [place] - tamiflu   -  per IM  Elevated troponin CAC on CT - HS trop up to 113, likely supply demand mismatch in the setting of acute HFrEF and acute hypoxic respi failure - ASA 81mg  daily and Crestor  20mg  daily - plan for R/L heart cath as above   For questions or updates, please contact [facility] Please consult [email or web address] for contact info under         Signed, Jalan Fariss VEAR Fishman, PA-C   "

## 2024-03-09 NOTE — Progress Notes (Signed)
" °  PROGRESS NOTE    Caleb Reynolds  FMW:[id number] DOB: 15-Oct-1935 DOA: 03/07/2024 PCP: Alla Amis, MD  [id number]  LOS: 2 days   Brief hospital course:   Assessment & Plan: Caleb Reynolds is a 89 y.o. male with medical history significant of HTN, HLD, Alzheimers dementia but lives independently, CVA without residual deficits, GI bleeding 2/2 NSAID induced PUD, presents to the ED for evaluaiton of sudden onset SOB.  87% RA -- non-rebreather at 95% -- during transport desat and placed on CPAP at 88%.     #Acute hypoxic respiratory failure  #acute decompensated HFrEF  --weaned down to 4L today --current Echo showed LVEF 20 to 25%. --cardio consulted --cont IV lasix  40 BID   #Sepsis, ruled out # PNA ruled out --d/c'ed abx   #HTN  - BP initially soft but fluid responsive. --hold antihypertensives   #Hx GIB  - 2/2 NSAID induced gastropathy years ago. hgb stable, monitor   --cont PPI  # Flu A --cont Tamiful  #T2NSTEMI     DVT prophylaxis: Lovenox  SQ Code Status: Full code  Family Communication: family updated at bedside today Level of care: Progressive Dispo:   The patient is from: home Anticipated d/c is to: home Anticipated d/c date is: > 3 days   Subjective and Interval History:  Pt reported breathing improved.  Reported good urine output.   Objective: Vitals:   03/09/24 0806 03/09/24 1258 03/09/24 1643 03/09/24 1933  BP: 105/66 115/76 113/73 98/64  Pulse: 96 (!) 103 95 100  Resp:    18  Temp: 98.2 F (36.8 C) 97.7 F (36.5 C) 98 F (36.7 C) 98.1 F (36.7 C)  TempSrc: Oral Oral Oral   SpO2: 95% 96% 97% 96%  Weight:   66.9 kg   Height:        Intake/Output Summary (Last 24 hours) at 03/09/2024 1958 Last data filed at 03/09/2024 1643 Gross per 24 hour  Intake 1160 ml  Output 500 ml  Net 660 ml   Filed Weights   03/07/24 1012 03/09/24 0500 03/09/24 1643  Weight: 74.6 kg 65.8 kg 66.9 kg    Examination:   Constitutional: NAD,  alert, oriented to person and place HEENT: conjunctivae and lids normal, EOMI CV: No cyanosis.   RESP: normal respiratory effort, on 4L Neuro: II - XII grossly intact.   Psych: Normal mood and affect.     Data Reviewed: I have personally reviewed labs and imaging studies  Time spent: 35 minutes  Ellouise Haber, MD [facility] If 7PM-7AM, please contact night-coverage 03/09/2024, 7:58 PM   "

## 2024-03-10 ENCOUNTER — Encounter: Admission: EM | Disposition: A | Payer: Self-pay | Source: Home / Self Care | Attending: Emergency Medicine

## 2024-03-10 DIAGNOSIS — I5021 Acute systolic (congestive) heart failure: Secondary | ICD-10-CM | POA: Diagnosis not present

## 2024-03-10 DIAGNOSIS — J9601 Acute respiratory failure with hypoxia: Secondary | ICD-10-CM | POA: Diagnosis not present

## 2024-03-10 DIAGNOSIS — I2510 Atherosclerotic heart disease of native coronary artery without angina pectoris: Secondary | ICD-10-CM | POA: Diagnosis not present

## 2024-03-10 LAB — BASIC METABOLIC PANEL WITH GFR
Anion gap: 14 (ref 5–15)
BUN: 31 mg/dL — ABNORMAL HIGH (ref 8–23)
CO2: 24 mmol/L (ref 22–32)
Calcium: 8.3 mg/dL — ABNORMAL LOW (ref 8.9–10.3)
Chloride: 99 mmol/L (ref 98–111)
Creatinine, Ser: 1.17 mg/dL (ref 0.61–1.24)
GFR, Estimated: >60 mL/min
Glucose, Bld: 96 mg/dL (ref 70–99)
Potassium: 3.4 mmol/L — ABNORMAL LOW (ref 3.5–5.1)
Sodium: 137 mmol/L (ref 135–145)

## 2024-03-10 LAB — MAGNESIUM: Magnesium: 2.2 mg/dL (ref 1.7–2.4)

## 2024-03-10 LAB — POCT I-STAT EG7
Acid-Base Excess: 1 mmol/L (ref 0.0–2.0)
Bicarbonate: 26.3 mmol/L (ref 20.0–28.0)
Calcium, Ion: 1.16 mmol/L (ref 1.15–1.40)
HCT: 41 % (ref 39.0–52.0)
Hemoglobin: 13.9 g/dL (ref 13.0–17.0)
O2 Saturation: 69 %
Potassium: 3.5 mmol/L (ref 3.5–5.1)
Sodium: 139 mmol/L (ref 135–145)
TCO2: 28 mmol/L (ref 22–32)
pCO2, Ven: 41.8 mmHg — ABNORMAL LOW (ref 44–60)
pH, Ven: 7.407 (ref 7.25–7.43)
pO2, Ven: 36 mmHg (ref 32–45)

## 2024-03-10 MED ORDER — HEPARIN SODIUM (PORCINE) 1000 UNIT/ML IJ SOLN
INTRAMUSCULAR | Status: AC
  Filled 2024-03-10: qty 10

## 2024-03-10 MED ORDER — HEPARIN (PORCINE) IN NACL 1000-0.9 UT/500ML-% IV SOLN
INTRAVENOUS | Status: DC | PRN
  Administered 2024-03-10: 1000 mL

## 2024-03-10 MED ORDER — VERAPAMIL HCL 2.5 MG/ML IV SOLN
INTRAVENOUS | Status: DC | PRN
  Administered 2024-03-10: 2.5 mg via INTRACORONARY

## 2024-03-10 MED ORDER — MIDAZOLAM HCL 2 MG/2ML IJ SOLN
INTRAMUSCULAR | Status: AC
  Filled 2024-03-10: qty 2

## 2024-03-10 MED ORDER — IOHEXOL 300 MG/ML  SOLN
INTRAMUSCULAR | Status: DC | PRN
  Administered 2024-03-10: 68 mL

## 2024-03-10 MED ORDER — SODIUM CHLORIDE 0.9% FLUSH: 3.0000 mL | INTRAVENOUS | Status: DC | PRN

## 2024-03-10 MED ORDER — VERAPAMIL HCL 2.5 MG/ML IV SOLN
INTRAVENOUS | Status: AC
  Filled 2024-03-10: qty 2

## 2024-03-10 MED ORDER — LIDOCAINE HCL (PF) 1 % IJ SOLN
INTRAMUSCULAR | Status: DC | PRN
  Administered 2024-03-10 (×2): 5 mL

## 2024-03-10 MED ORDER — SODIUM CHLORIDE 0.9 % IV SOLN: 250.0000 mL | INTRAVENOUS | Status: AC | PRN

## 2024-03-10 MED ORDER — FENTANYL CITRATE (PF) 100 MCG/2ML IJ SOLN
INTRAMUSCULAR | Status: AC
  Filled 2024-03-10: qty 2

## 2024-03-10 MED ORDER — LIDOCAINE HCL 1 % IJ SOLN
INTRAMUSCULAR | Status: AC
  Filled 2024-03-10: qty 20

## 2024-03-10 MED ORDER — HEPARIN (PORCINE) IN NACL 1000-0.9 UT/500ML-% IV SOLN
INTRAVENOUS | Status: AC
  Filled 2024-03-10: qty 1000

## 2024-03-10 MED ORDER — HEPARIN SODIUM (PORCINE) 1000 UNIT/ML IJ SOLN
INTRAMUSCULAR | Status: DC | PRN
  Administered 2024-03-10: 3000 [IU] via INTRAVENOUS

## 2024-03-10 MED ORDER — SODIUM CHLORIDE 0.9% FLUSH
3.0000 mL | Freq: Two times a day (BID) | INTRAVENOUS | Status: DC
  Administered 2024-03-10 – 2024-03-13 (×6): 3 mL via INTRAVENOUS

## 2024-03-10 NOTE — Plan of Care (Signed)

## 2024-03-10 NOTE — Progress Notes (Signed)
 Heart Failure Nurse Navigator Progress Note  PCP: Alla Amis, MD PCP-Cardiologist: New to [facility]  Admission Diagnosis: Flash pulmonary edema (HCC) Pneumonia due to infectious organism, unspecified laterality, unspecified part of lung Respiratory distress Left bundle branch block Reduced ejection fraction concurrent with and due to acute heart failure [facility]) Admitted from: Home via [facility]  Presentation:   Caleb Reynolds is a 89 y.o. male who presented in respiratory distress.  On arrival he was hypoxic. Congestion and dry cough for 3 days. BP 134/73, HR 130, RR 48, 87% RA. Left bundle branch block. Medical history of hypertension, hyperlipidemia, mild memory loss, peptic ulcer disease, kidney stones. 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 Pro-BNP 2,259.  HS-Troponin 91. CXR-Bilateral patchy airspace opacities, right worse than left, with diffuse interstitial prominence, which may reflect atypical pulmonary edema and  or multifocal infection.  ECHO/ LVEF: 20-25% Heart Cath Pending on 03/10/24  Clinical Course:  Past Medical History:  Diagnosis Date   Chronic kidney disease    kidney stones   Hypertension      Social History   Socioeconomic History   Marital status: Widowed    Spouse name: Not on file   Number of children: Not on file   Years of education: Not on file   Highest education level: Not on file  Occupational History   Not on file  Tobacco Use   Smoking status: Former   Smokeless tobacco: Never  Vaping Use   Vaping status: Never Used  Substance and Sexual Activity   Alcohol use: No   Drug use: No   Sexual activity: Not on file  Other Topics Concern   Not on file  Social History Narrative   Not on file   Social Drivers of Health   Tobacco Use: Medium Risk (03/08/2024)   Patient History    Smoking Tobacco Use: Former    Smokeless Tobacco Use: Never    Passive Exposure: Not on file  Financial Resource Strain: Low Risk   (12/20/2023)   Received from [facility]   Overall Financial Resource Strain (CARDIA)    Difficulty of Paying Living Expenses: Not hard at all  Food Insecurity: No Food Insecurity (03/09/2024)   Epic    Worried About Radiation Protection Practitioner of Food in the Last Year: Never true    Ran Out of Food in the Last Year: Never true  Transportation Needs: No Transportation Needs (03/09/2024)   Epic    Lack of Transportation (Medical): No    Lack of Transportation (Non-Medical): No  Physical Activity: Not on file  Stress: Not on file  Social Connections: Unknown (03/09/2024)   Social Connection and Isolation Panel    Frequency of Communication with Friends and Family: More than three times a week    Frequency of Social Gatherings with Friends and Family: More than three times a week    Attends Religious Services: Not on file    Active Member of Clubs or Organizations: Not on file    Attends Banker Meetings: Not on file    Marital Status: Not on file  Depression (PHQ2-9): Not on file  Alcohol Screen: Not on file  Housing: Low Risk (03/09/2024)   Epic    Unable to Pay for Housing in the Last Year: No    Number of Times Moved in the Last Year: 0    Homeless in the Last Year: No  Utilities: Not At Risk (03/09/2024)   Epic  Threatened with loss of utilities: No  Health Literacy: Not on file   Education Assessment and Provision:  Son Shermon at the bedside for CHF education and scheduling CHF TOC.  Detailed education and instructions provided on heart failure disease management including the following:  Signs and symptoms of Heart Failure When to call the physician Importance of daily weights Low sodium diet Fluid restriction Medication management Anticipated future follow-up appointments  Patient education given on each of the above topics.  Patient acknowledges understanding via teach back method and acceptance of all instructions.  Education Materials:  Living  Better With Heart Failure Booklet, HF zone tool, & Daily Weight Tracker Tool.  Patient has scale at home: Yes Patient has pill box at home: Yes    High Risk Criteria for Readmission and/or Poor Patient Outcomes: Heart failure hospital admissions (last 6 months): 1  No Show rate: 0% Difficult social situation: Mild Memory loss Demonstrates medication adherence: Yes Primary Language: English  Literacy level: Reading, Writing, Comprehension  Barriers of Care:   Daily Weights Diet & Fluid Restrictions Mild Memory loss-lives independently and still works.  Considerations/Referrals:  Referral made to Heart Failure Pharmacist Stewardship: Yes Referral made to Heart Failure CSW/NCM TOC: No Referral made to Heart & Vascular TOC clinic: Yes. [facility] TOC 03/24/24 @ 1:30.  Items for Follow-up on DC/TOC: Daily Weights Diet & Fluid Restrictions Continued Heart Failure Education Requesting Cardiac Rehab when appropriate Meds to Bed for Discharge -he will be going to daughters house in [place], [place] for a week after hospital discharge then return to home in [place].  Charmaine Pines, RN, BSN [facility] Heart Failure Navigator Secure Chat Only

## 2024-03-10 NOTE — Progress Notes (Signed)
" °  PROGRESS NOTE    Caleb Reynolds  FMW:[id number] DOB: 1935/06/06 DOA: 03/07/2024 PCP: Alla Amis, MD  [id number]  LOS: 3 days   Brief hospital course:   Assessment & Plan: Caleb Reynolds is a 89 y.o. male with medical history significant of HTN, HLD, Alzheimers dementia but lives independently, CVA without residual deficits, GI bleeding 2/2 NSAID induced PUD, presents to the ED for evaluaiton of sudden onset SOB.  87% RA -- non-rebreather at 95% -- during transport desat and placed on CPAP at 88%.     #Acute hypoxic respiratory failure  #acute decompensated HFrEF  --weaned down to 4L today --current Echo showed LVEF 20 to 25%. --cardio consulted --cont IV lasix  40 BID  CAD --heart cath today --cont Asa and statin   #Sepsis, ruled out # PNA ruled out --d/c'ed abx   #HTN  - BP initially soft but fluid responsive. --hold antihypertensives   #Hx GIB  - 2/2 NSAID induced gastropathy years ago. hgb stable, monitor   --cont PPI  # Flu A --cont Tamiful  #T2NSTEMI   Hypokalemia --monitor and supplement PRN    DVT prophylaxis: Lovenox  SQ Code Status: Full code  Family Communication: family updated at bedside today Level of care: Progressive Dispo:   The patient is from: home Anticipated d/c is to: home Anticipated d/c date is: 2-3 days   Subjective and Interval History:  Pt reported feeling better.  Underwent R/L heat cath, tolerated it well.   Objective: Vitals:   03/10/24 1502 03/10/24 1515 03/10/24 1636 03/10/24 1959  BP: 107/72 106/62 102/66 106/70  Pulse: 87 86 86 (!) 102  Resp: (!) 21 (!) 25  18  Temp: (!) 97.4 F (36.3 C)  97.9 F (36.6 C) 97.6 F (36.4 C)  TempSrc: Temporal  Oral   SpO2: 95% 93% 96% 99%  Weight:      Height:        Intake/Output Summary (Last 24 hours) at 03/10/2024 2030 Last data filed at 03/10/2024 1945 Gross per 24 hour  Intake 730 ml  Output 1100 ml  Net -370 ml   Filed Weights   03/09/24 0500  03/09/24 1643 03/10/24 0522  Weight: 65.8 kg 66.9 kg 64.8 kg    Examination:   Constitutional: NAD, alert, oriented  HEENT: conjunctivae and lids normal, EOMI CV: No cyanosis.   RESP: normal respiratory effort Neuro: II - XII grossly intact.   Psych: Normal mood and affect.     Data Reviewed: I have personally reviewed labs and imaging studies  Time spent: 50 minutes  Ellouise Haber, MD [facility] If 7PM-7AM, please contact night-coverage 03/10/2024, 8:30 PM   "

## 2024-03-10 NOTE — Interval H&P Note (Signed)
 History and Physical Interval Note:  03/10/2024 2:11 PM  Caleb Reynolds  has presented today for surgery, with the diagnosis of cardiomyopathy.  The various methods of treatment have been discussed with the patient and family. After consideration of risks, benefits and other options for treatment, the patient has consented to  Procedures: RIGHT/LEFT HEART CATH AND CORONARY ANGIOGRAPHY (N/A) as a surgical intervention.  The patient's history has been reviewed, patient examined, no change in status, stable for surgery.  I have reviewed the patient's chart and labs.  Questions were answered to the patient's satisfaction.     Geanna Divirgilio

## 2024-03-11 DIAGNOSIS — I255 Ischemic cardiomyopathy: Secondary | ICD-10-CM | POA: Diagnosis not present

## 2024-03-11 DIAGNOSIS — I420 Dilated cardiomyopathy: Secondary | ICD-10-CM | POA: Diagnosis not present

## 2024-03-11 DIAGNOSIS — J9601 Acute respiratory failure with hypoxia: Secondary | ICD-10-CM | POA: Diagnosis not present

## 2024-03-11 DIAGNOSIS — I509 Heart failure, unspecified: Secondary | ICD-10-CM | POA: Diagnosis not present

## 2024-03-11 LAB — BASIC METABOLIC PANEL WITH GFR
Anion gap: 14 (ref 5–15)
BUN: 26 mg/dL — ABNORMAL HIGH (ref 8–23)
CO2: 25 mmol/L (ref 22–32)
Calcium: 8.3 mg/dL — ABNORMAL LOW (ref 8.9–10.3)
Chloride: 100 mmol/L (ref 98–111)
Creatinine, Ser: 1.13 mg/dL (ref 0.61–1.24)
GFR, Estimated: >60 mL/min
Glucose, Bld: 93 mg/dL (ref 70–99)
Potassium: 3.2 mmol/L — ABNORMAL LOW (ref 3.5–5.1)
Sodium: 139 mmol/L (ref 135–145)

## 2024-03-11 LAB — MAGNESIUM: Magnesium: 2.1 mg/dL (ref 1.7–2.4)

## 2024-03-11 MED ORDER — METOPROLOL SUCCINATE ER 25 MG PO TB24
12.5000 mg | ORAL_TABLET | Freq: Every day | ORAL | Status: DC
  Filled 2024-03-11: qty 1

## 2024-03-11 MED ORDER — POTASSIUM CHLORIDE CRYS ER 20 MEQ PO TBCR
40.0000 meq | EXTENDED_RELEASE_TABLET | Freq: Two times a day (BID) | ORAL | Status: AC
  Administered 2024-03-11 (×2): 40 meq via ORAL
  Filled 2024-03-11 (×2): qty 2

## 2024-03-11 MED ORDER — LOSARTAN POTASSIUM 25 MG PO TABS: 12.5000 mg | ORAL_TABLET | Freq: Every day | ORAL | Status: DC

## 2024-03-11 MED ORDER — METOPROLOL TARTRATE 25 MG PO TABS
12.5000 mg | ORAL_TABLET | Freq: Two times a day (BID) | ORAL | Status: AC
  Administered 2024-03-11 – 2024-03-12 (×4): 12.5 mg via ORAL
  Filled 2024-03-11 (×4): qty 1

## 2024-03-11 MED ORDER — POTASSIUM CHLORIDE CRYS ER 20 MEQ PO TBCR: 40.0000 meq | EXTENDED_RELEASE_TABLET | Freq: Two times a day (BID) | ORAL | Status: DC

## 2024-03-11 NOTE — Plan of Care (Signed)

## 2024-03-11 NOTE — Progress Notes (Addendum)
 "  Progress Note  Patient Name: Caleb Reynolds Date of Encounter: 03/11/2024 [facility] Cardiologist: None   Interval Summary    Cath yesterday 90% ostial LAD, 50% ostial LCx, 50% ostial LM, 90% mid LCx, moderate RCA disease.  RHC with CI 2.6 and PCWP 22.  Overall continuing to improve.  Had some issues overnight with delirium.  Vital Signs Vitals:   03/10/24 1959 03/11/24 0049 03/11/24 0345 03/11/24 0838  BP: 106/70 112/70 113/72 105/66  Pulse: (!) 102 96 91 75  Resp: 18 18 18    Temp: 97.6 F (36.4 C) 97.6 F (36.4 C) 97.6 F (36.4 C) 97.6 F (36.4 C)  TempSrc:   Oral   SpO2: 99% 95% 95% 97%  Weight:      Height:        Intake/Output Summary (Last 24 hours) at 03/11/2024 0858 Last data filed at 03/11/2024 0049 Gross per 24 hour  Intake 240 ml  Output 1000 ml  Net -760 ml      03/10/2024    5:22 AM 03/09/2024    4:43 PM 03/09/2024    5:00 AM  Last 3 Weights  Weight (lbs) 142 lb 13.7 oz 147 lb 7.8 oz 145 lb 1 oz  Weight (kg) 64.8 kg 66.9 kg 65.8 kg      Telemetry/ECG  Sinus rhythm- Personally Reviewed  Physical Exam  GEN: No acute distress.   Neck: No JVD Cardiac: RR, tachy, no murmurs, rubs, or gallops.  Respiratory: Clear to auscultation bilaterally. GI: Soft, nontender, non-distended  MS: No edema  Cardiac Studies Cath 03/10/2024 90% ostial LAD, 50% ostial LCx, 50% ostial LM, 90% mid LCx, moderate RCA disease.  RHC with CI 2.6 and PCWP 22.   TTE 03/07/2024 1. Left ventricular ejection fraction, by estimation, is 20 to 25%. The  left ventricle has severely decreased function. The left ventricle  demonstrates global hypokinesis. Left ventricular diastolic parameters are  indeterminate.   2. Right ventricular systolic function is normal. The right ventricular  size is normal. There is mildly elevated pulmonary artery systolic  pressure.   3. Left atrial size was mildly dilated.   4. Moderate pleural effusion in the left lateral region.   5.  The mitral valve is normal in structure. Mild to moderate mitral valve  regurgitation.   6. The aortic valve has an indeterminant number of cusps. There is mild  calcification of the aortic valve. There is mild thickening of the aortic  valve. Aortic valve regurgitation is not visualized. Aortic valve  sclerosis/calcification is present,  without any evidence of aortic stenosis.   7. The inferior vena cava is dilated in size with <50% respiratory  variability, suggesting right atrial pressure of 15 mmHg.    Patient Profile   89 y.o. male with past medical history of hypertension, hyperlipidemia, stroke, peptic ulcer disease with at least 2 significant GI bleeds, kidney stones, stomach disease, who has been seen and evaluated for abnormal EKG and shortness of breath with newly found cardiomyopathy on echocardiogram with an LVEF of 20-25%.  Assessment & Plan   Acute decompensated heart failure Ischemic cardiomyopathy Acute hypoxemic respiratory failure Patient presents with acute decompensated heart failure found to have newly reduced EF 20 to 25%.  Cath showed significant multivessel disease with severe LAD/Lcx dz and significant ostial LM dz. RHC showed preserved cardiac output with mildly elevated wedge pressure.  Unlikely to be a candidate for CABG given age.  May consider complex PCI after he has been  better compensated.  Plan: - Continue ASA 81 mg daily - Continue Crestor  20 mg daily; last LDL 40 - Continue IV Lasix  40 mg twice daily given elevated wedge pressure; will transition to p.o. once azotemia develops - Will start low-dose metoprolol  tartrate 12.5mg  BID for HF GDMT given preserved cardiac output on RHC; gradually consolidate/increase as tolerated - Add ARB/SGLT-2 inhibitor/MRA in coming days pending BP/renal function   Acute respiratory failure FLU A infection - likely secondary to acute HFrEF with pulmonary edema, bil pleural effusions through concurrent multifactorial PNA  cannot be excluded - IV lasix  as above - off Bipap. On 2L [place] - tamiflu   - per IM    For questions or updates, please contact [facility] Please consult [email or web address] for contact info under         Signed, Caron Poser, MD   "

## 2024-03-12 DIAGNOSIS — J9601 Acute respiratory failure with hypoxia: Secondary | ICD-10-CM | POA: Diagnosis not present

## 2024-03-12 LAB — BASIC METABOLIC PANEL WITH GFR
Anion gap: 13 (ref 5–15)
BUN: 24 mg/dL — ABNORMAL HIGH (ref 8–23)
CO2: 26 mmol/L (ref 22–32)
Calcium: 8.6 mg/dL — ABNORMAL LOW (ref 8.9–10.3)
Chloride: 100 mmol/L (ref 98–111)
Creatinine, Ser: 1.16 mg/dL (ref 0.61–1.24)
GFR, Estimated: >60 mL/min
Glucose, Bld: 94 mg/dL (ref 70–99)
Potassium: 3.7 mmol/L (ref 3.5–5.1)
Sodium: 139 mmol/L (ref 135–145)

## 2024-03-12 LAB — POCT I-STAT 7, (LYTES, BLD GAS, ICA,H+H)
Acid-Base Excess: 0 mmol/L (ref 0.0–2.0)
Bicarbonate: 23 mmol/L (ref 20.0–28.0)
Calcium, Ion: 1.13 mmol/L — ABNORMAL LOW (ref 1.15–1.40)
HCT: 42 % (ref 39.0–52.0)
Hemoglobin: 14.3 g/dL (ref 13.0–17.0)
O2 Saturation: 96 %
Potassium: 3.5 mmol/L (ref 3.5–5.1)
Sodium: 138 mmol/L (ref 135–145)
TCO2: 24 mmol/L (ref 22–32)
pCO2 arterial: 33.5 mmHg (ref 32–48)
pH, Arterial: 7.445 (ref 7.35–7.45)
pO2, Arterial: 79 mmHg — ABNORMAL LOW (ref 83–108)

## 2024-03-12 LAB — CULTURE, BLOOD (ROUTINE X 2)
Culture: NO GROWTH
Culture: NO GROWTH
Special Requests: ADEQUATE

## 2024-03-12 MED ORDER — FUROSEMIDE 40 MG PO TABS
40.0000 mg | ORAL_TABLET | Freq: Every day | ORAL | Status: DC
  Administered 2024-03-12 – 2024-03-13 (×2): 40 mg via ORAL
  Filled 2024-03-12 (×2): qty 1

## 2024-03-12 MED ORDER — METOPROLOL SUCCINATE ER 25 MG PO TB24
25.0000 mg | ORAL_TABLET | Freq: Every day | ORAL | Status: DC
  Administered 2024-03-13: 25 mg via ORAL
  Filled 2024-03-12: qty 1

## 2024-03-12 MED ORDER — DAPAGLIFLOZIN PROPANEDIOL 10 MG PO TABS
10.0000 mg | ORAL_TABLET | Freq: Every day | ORAL | Status: DC
  Administered 2024-03-12 – 2024-03-13 (×2): 10 mg via ORAL
  Filled 2024-03-12 (×2): qty 1

## 2024-03-12 MED ORDER — POTASSIUM CHLORIDE CRYS ER 20 MEQ PO TBCR
40.0000 meq | EXTENDED_RELEASE_TABLET | Freq: Once | ORAL | Status: AC
  Administered 2024-03-12: 40 meq via ORAL
  Filled 2024-03-12: qty 2

## 2024-03-12 NOTE — Plan of Care (Signed)

## 2024-03-12 NOTE — Progress Notes (Signed)
" °  PROGRESS NOTE    Caleb Reynolds  FMW:[id number] DOB: [birth year] DOA: 03/07/2024 PCP: Alla Amis, MD  [id number]  LOS: 5 days   Brief hospital course:   Assessment & Plan: Caleb Reynolds is a 89 y.o. male with medical history significant of HTN, HLD, Alzheimers dementia but lives independently, CVA without residual deficits, GI bleeding 2/2 NSAID induced PUD, presents to the ED for evaluaiton of sudden onset SOB.  87% RA -- non-rebreather at 95% -- during transport desat and placed on CPAP at 88%.     #Acute hypoxic respiratory failure 2/2 #acute decompensated HFrEF  --weaned down to 2L today --current Echo showed LVEF 20 to 25%. --cardio consulted --s/p IV lasix  40 BID --transition to oral Lasix  40 mg daily --cont Lopressor  (new) - Start Farxiga  10 mg daily today   CAD Ischemic cardiomyopathy  --heart cath showed significant multivessel disease with severe LAD/Lcx dz and significant ostial LM dz. --cont ASA and statin --cardio and family to discuss tx plans.   #Sepsis, ruled out # PNA ruled out --d/c'ed abx   #HTN, not currently active - BP initially soft but fluid responsive.  #Hx GIB  - 2/2 NSAID induced gastropathy years ago. hgb stable, monitor   --cont PPI  # Flu A --cont Tamiflu   #T2NSTEMI   Hypokalemia --monitor and supplement PRN    DVT prophylaxis: Lovenox  SQ Code Status: Full code  Family Communication: family updated at bedside today Level of care: Progressive Dispo:   The patient is from: home Anticipated d/c is to: home Anticipated d/c date is: 1-2 days   Subjective and Interval History:  Pt reported being very active this morning.  Off of supplemental O2.   Objective: Vitals:   03/12/24 0102 03/12/24 0700 03/12/24 0803 03/12/24 1333  BP: 105/74  111/67 110/68  Pulse: 87  74 77  Resp: 16 18 18 18   Temp: 97.9 F (36.6 C)  (!) 97.5 F (36.4 C) 98 F (36.7 C)  TempSrc: Oral     SpO2: 93%  93% 98%  Weight:       Height:       No intake or output data in the 24 hours ending 03/12/24 1822  Filed Weights   03/09/24 0500 03/09/24 1643 03/10/24 0522  Weight: 65.8 kg 66.9 kg 64.8 kg    Examination:   Constitutional: NAD, alert, oriented  HEENT: conjunctivae and lids normal, EOMI CV: No cyanosis.   RESP: normal respiratory effort, on RA Neuro: II - XII grossly intact.     Data Reviewed: I have personally reviewed labs and imaging studies  Time spent: 35 minutes  Ellouise Haber, MD [facility] If 7PM-7AM, please contact night-coverage 03/12/2024, 6:22 PM   "

## 2024-03-13 ENCOUNTER — Telehealth: Payer: Self-pay | Admitting: Pharmacist

## 2024-03-13 ENCOUNTER — Other Ambulatory Visit: Payer: Self-pay

## 2024-03-13 ENCOUNTER — Encounter: Payer: Self-pay | Admitting: Cardiovascular Disease

## 2024-03-13 ENCOUNTER — Other Ambulatory Visit (HOSPITAL_COMMUNITY): Payer: Self-pay

## 2024-03-13 ENCOUNTER — Other Ambulatory Visit: Payer: Self-pay | Admitting: *Deleted

## 2024-03-13 DIAGNOSIS — I739 Peripheral vascular disease, unspecified: Secondary | ICD-10-CM

## 2024-03-13 LAB — BASIC METABOLIC PANEL WITH GFR
Anion gap: 13 (ref 5–15)
BUN: 21 mg/dL (ref 8–23)
CO2: 25 mmol/L (ref 22–32)
Calcium: 8.8 mg/dL — ABNORMAL LOW (ref 8.9–10.3)
Chloride: 101 mmol/L (ref 98–111)
Creatinine, Ser: 1.11 mg/dL (ref 0.61–1.24)
GFR, Estimated: >60 mL/min
Glucose, Bld: 98 mg/dL (ref 70–99)
Potassium: 3.8 mmol/L (ref 3.5–5.1)
Sodium: 139 mmol/L (ref 135–145)

## 2024-03-13 LAB — MAGNESIUM: Magnesium: 2.3 mg/dL (ref 1.7–2.4)

## 2024-03-13 MED ORDER — METOPROLOL SUCCINATE ER 25 MG PO TB24
25.0000 mg | ORAL_TABLET | Freq: Every day | ORAL | 1 refills | Status: DC
  Filled 2024-03-13: qty 30, 30d supply, fill #0

## 2024-03-13 MED ORDER — FUROSEMIDE 40 MG PO TABS
40.0000 mg | ORAL_TABLET | Freq: Every day | ORAL | 1 refills | Status: DC
  Filled 2024-03-13: qty 30, 30d supply, fill #0

## 2024-03-13 MED ORDER — METOPROLOL SUCCINATE ER 25 MG PO TB24: 25.0000 mg | ORAL_TABLET | Freq: Every day | ORAL | 1 refills | Status: AC

## 2024-03-13 MED ORDER — FUROSEMIDE 40 MG PO TABS: 40.0000 mg | ORAL_TABLET | Freq: Every day | ORAL | 1 refills | Status: AC

## 2024-03-13 MED ORDER — RIVASTIGMINE TARTRATE 1.5 MG PO CAPS
1.5000 mg | ORAL_CAPSULE | Freq: Two times a day (BID) | ORAL | 0 refills | Status: AC
  Filled 2024-03-13: qty 60, 30d supply, fill #0

## 2024-03-13 MED ORDER — DAPAGLIFLOZIN PROPANEDIOL 10 MG PO TABS: 10.0000 mg | ORAL_TABLET | Freq: Every day | ORAL | 1 refills | Status: AC

## 2024-03-13 MED ORDER — ROSUVASTATIN CALCIUM 20 MG PO TABS
20.0000 mg | ORAL_TABLET | Freq: Every day | ORAL | 0 refills | Status: AC
  Filled 2024-03-13: qty 30, 30d supply, fill #0

## 2024-03-13 MED ORDER — DAPAGLIFLOZIN PROPANEDIOL 10 MG PO TABS
10.0000 mg | ORAL_TABLET | Freq: Every day | ORAL | 1 refills | Status: DC
  Filled 2024-03-13: qty 30, 30d supply, fill #0

## 2024-03-13 NOTE — Plan of Care (Signed)

## 2024-03-14 ENCOUNTER — Telehealth: Payer: Self-pay | Admitting: Cardiovascular Disease

## 2024-03-14 LAB — LIPOPROTEIN A (LPA): Lipoprotein (a): 143.7 nmol/L — ABNORMAL HIGH

## 2024-03-14 NOTE — Telephone Encounter (Signed)
-----   Message from Nurse Olam SAUNDERS, RN sent at 03/13/2024  2:36 PM EST ----- Please see below-thank you ----- Message ----- From: Darron Deatrice LABOR, MD Sent: 03/13/2024  12:58 PM EST To: [name], RN  Please arrange for this patient to have an ABI and aortoiliac duplex within 2 weeks for PAD and follow-up with me on the same day.  He will be discharged from the hospital later today.

## 2024-03-14 NOTE — Telephone Encounter (Signed)
 Called to schedule appts, no answer.

## 2024-03-15 ENCOUNTER — Other Ambulatory Visit (HOSPITAL_BASED_OUTPATIENT_CLINIC_OR_DEPARTMENT_OTHER): Payer: Self-pay

## 2024-03-15 ENCOUNTER — Encounter: Payer: Self-pay | Admitting: Cardiovascular Disease

## 2024-03-17 ENCOUNTER — Ambulatory Visit: Payer: Self-pay | Admitting: Internal Medicine

## 2024-03-17 NOTE — Heart Team MDD (Signed)
" ° °  Heart Team Multi-Disciplinary Discussion  Patient: Caleb Reynolds  DOB: [birth year]  MRN: [id number]   Date: 03/17/2024  9:00 AM    Attendees: Interventional Cardiology: Lurena Red, MD Lonni Hanson, MD Alm Clay, MD Gordy Bergamo, MD Newman Lawrence, MD Peter Jordan, MD Deatrice Cage, MD  Cardiothoracic Surgery: Linnie Rayas, MD   Additional Attendees: Con Clunes, MD Stacia Beauvais, MD   Patient History: 89 y.o. male with a hx of hypertension, hyperlipidemia, stroke, peptic ulcer disease with at least 2 significant GI bleeds, kidney stones, and Alzheimer's disease, new LBBB, cardiomyopathy, acute systolic CHF: EF 79-74%, NSTEMI, CAD   Risk Factors: Hypertension Hyperlipidemia History of Stroke or TIA   CAD History: None prior to 03/10/24; Now with heavily calcified coronary arteries with severe two-vessel coronary artery disease (ostial to prox LAD and mid to distal LCx) and moderate ostial left main stenosis.   Review of Prior Angiography and PCI Procedures: The team reviewed the findings from his echo 03/07/24 and cardiac angiography 03/10/24.   Discussion: After multidisciplinary discussion, the Heart Team agreed on a primary strategy to optimize guideline-directed medical therapy for heart failure and allow for recovery following acute illness.  No immediate invasive or surgical intervention at this time. Reassessment of clinical status and LV function after medical optimization and recovery from influenza. Deferring invasive or surgical intervention unless symptoms persist or clinical status evolves. Future consideration of additional therapies contingent upon symptom trajectory, recovery of EF, and goals-of-care discussions with patient and family.   Recommendations: Medical Therapy     Shona Palma, RN  03/17/2024 9:00 AM   "

## 2024-03-24 ENCOUNTER — Ambulatory Visit: Admitting: Family

## 2024-03-28 ENCOUNTER — Ambulatory Visit

## 2024-03-28 ENCOUNTER — Ambulatory Visit: Admitting: Cardiovascular Disease
